# Patient Record
Sex: Male | Born: 1980 | Race: Black or African American | Hispanic: No | Marital: Single | Smoking: Current every day smoker
Health system: Southern US, Community
[De-identification: ages and names within clinical notes are randomized; demographics above are authoritative.]

---
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
## 2009-03-10 HISTORY — PX: CYST EXCISION: SHX5701

## 2012-01-15 ENCOUNTER — Emergency Department (HOSPITAL_COMMUNITY)
Admission: EM | Admit: 2012-01-15 | Discharge: 2012-01-15 | Disposition: A | Discharge status: Home / Self Care | Payer: Self-pay | Attending: Emergency Medicine | Admitting: Emergency Medicine

## 2012-01-15 ENCOUNTER — Encounter (HOSPITAL_COMMUNITY): Payer: Self-pay | Admitting: *Deleted

## 2012-01-15 DIAGNOSIS — J029 Acute pharyngitis, unspecified: Secondary | ICD-10-CM

## 2012-01-15 DIAGNOSIS — F17200 Nicotine dependence, unspecified, uncomplicated: Secondary | ICD-10-CM | POA: Insufficient documentation

## 2012-01-15 LAB — RAPID STREP SCREEN (MED CTR MEBANE ONLY): Streptococcus, Group A Screen (Direct): NEGATIVE

## 2012-01-15 MED ORDER — IBUPROFEN 800 MG PO TABS: 800.0000 mg | ORAL_TABLET | Freq: Three times a day (TID) | ORAL | Status: DC

## 2012-01-15 NOTE — ED Notes (Signed)
Pt c/o sore throat, painful to swallow. Tonsils mildly enlarged, red, no white patches noted.

## 2012-01-15 NOTE — ED Provider Notes (Signed)
History     CSN: [id number]  Arrival date & time 01/15/12  2028   First MD Initiated Contact with Patient 01/15/12 2213      Chief Complaint  Patient presents with  . Sore Throat    (Consider location/radiation/quality/duration/timing/severity/associated sxs/prior treatment) HPI History provided by pt.   Pt presents w/ c/o sore throat.  Scratchy yesterday and mildy painful today.  Aggravated by swallowing.  Associated w/ nasal congestion only.  Denies fever.  No known sick contacts.  No PMH.  History reviewed. No pertinent past medical history.  Past Surgical History  Procedure Date  . Cyst excision 2011    throat    History reviewed. No pertinent family history.  History  Substance Use Topics  . Smoking status: Current Every Day Smoker -- 1.0 packs/day  . Smokeless tobacco: Not on file  . Alcohol Use: Yes      Review of Systems  All other systems reviewed and are negative.    Allergies  Review of patient's allergies indicates no known allergies.  Home Medications   Current Outpatient Rx  Name  Route  Sig  Dispense  Refill  . IBUPROFEN 200 MG PO TABS   Oral   Take 400 mg by mouth every 6 (six) hours as needed. For pain         . IBUPROFEN 800 MG PO TABS   Oral   Take 1 tablet (800 mg total) by mouth 3 (three) times daily.   12 tablet   0     BP 132/78  Pulse 80  Temp 98 F (36.7 C) (Oral)  Resp 16  SpO2 96%  Physical Exam  Nursing note and vitals reviewed. Constitutional: He is oriented to person, place, and time. He appears well-developed and well-nourished. No distress.       Pt does not appear uncomfortable.   HENT:  Head: Normocephalic and atraumatic.  Mouth/Throat: Oropharynx is clear and moist. No oropharyngeal exudate.       No erythema of soft palate, tonsils or posterior pharynx.  No trismus and uvula mid-line.  Eyes:       Normal appearance  Neck: Normal range of motion.  Cardiovascular: Normal rate and regular rhythm.     Pulmonary/Chest: Effort normal and breath sounds normal. No respiratory distress.  Musculoskeletal: Normal range of motion.  Lymphadenopathy:    He has no cervical adenopathy.  Neurological: He is alert and oriented to person, place, and time.  Skin: Skin is warm and dry. No rash noted.  Psychiatric: He has a normal mood and affect. His behavior is normal.    ED Course  Procedures (including critical care time)   Labs Reviewed  RAPID STREP SCREEN   No results found.   1. Pharyngitis       MDM  31yo healthy M presents w/ sore throat and nasal congestion.  Exam not concerning for strep pharyngitis and rapid strep screen neg.  Recommended ibuprofen and sudafed.  Return precautions discussed.         Otilio Miu, [place] 01/16/12 [phone]

## 2012-01-15 NOTE — ED Notes (Signed)
Pt c/o sore throat since yesterday, denies n/v, cough, fever/chills

## 2012-01-17 NOTE — ED Provider Notes (Signed)
Medical screening examination/treatment/procedure(s) were performed by non-physician practitioner and as supervising physician I was immediately available for consultation/collaboration.   Lemmie Steinhaus R Oreste Majeed, MD 01/17/12 1556 

## 2012-11-12 ENCOUNTER — Emergency Department (HOSPITAL_COMMUNITY)
Admission: EM | Admit: 2012-11-12 | Discharge: 2012-11-12 | Disposition: A | Discharge status: Home / Self Care | Payer: Self-pay | Attending: Emergency Medicine | Admitting: Emergency Medicine

## 2012-11-12 ENCOUNTER — Emergency Department (HOSPITAL_COMMUNITY): Payer: Self-pay

## 2012-11-12 ENCOUNTER — Encounter (HOSPITAL_COMMUNITY): Payer: Self-pay | Admitting: Emergency Medicine

## 2012-11-12 DIAGNOSIS — J069 Acute upper respiratory infection, unspecified: Secondary | ICD-10-CM | POA: Insufficient documentation

## 2012-11-12 DIAGNOSIS — F17200 Nicotine dependence, unspecified, uncomplicated: Secondary | ICD-10-CM | POA: Insufficient documentation

## 2012-11-12 DIAGNOSIS — J9801 Acute bronchospasm: Secondary | ICD-10-CM | POA: Insufficient documentation

## 2012-11-12 DIAGNOSIS — M549 Dorsalgia, unspecified: Secondary | ICD-10-CM | POA: Insufficient documentation

## 2012-11-12 MED ORDER — ALBUTEROL SULFATE HFA 108 (90 BASE) MCG/ACT IN AERS
2.0000 | INHALATION_SPRAY | RESPIRATORY_TRACT | Status: DC | PRN
  Administered 2012-11-12: 2 via RESPIRATORY_TRACT
  Filled 2012-11-12: qty 6.7

## 2012-11-12 NOTE — ED Notes (Signed)
Has had cough x 1 -2 weeks. Went to [facility] and saw a nurse yesterday. Later started having low back pain with cough. States it is better today.

## 2012-11-12 NOTE — ED Provider Notes (Signed)
CSN: [id number]     Arrival date & time 11/12/12  1153 History   First MD Initiated Contact with Patient 11/12/12 1341     Chief Complaint  Patient presents with  . Cough  . Back Pain   (Consider location/radiation/quality/duration/timing/severity/associated sxs/prior Treatment) Patient is a 32 y.o. male presenting with cough and back pain. The history is provided by the patient. No language interpreter was used.  Cough Cough characteristics:  Productive and hoarse Severity:  Moderate Progression:  Worsening Chronicity:  New Smoker: yes   Associated symptoms: chest pain and rhinorrhea   Associated symptoms: no chills, no fever and no shortness of breath   Associated symptoms comment:  Dry cough for the past week, worse when he lies down. No fever. He is complaining of chest pain and back pain associated with cough. No SOB.  Back Pain Associated symptoms: chest pain   Associated symptoms: no abdominal pain and no fever     History reviewed. No pertinent past medical history. Past Surgical History  Procedure Laterality Date  . Cyst excision  2011    throat   History reviewed. No pertinent family history. History  Substance Use Topics  . Smoking status: Current Every Day Smoker -- 1.00 packs/day  . Smokeless tobacco: Not on file  . Alcohol Use: Yes    Review of Systems  Constitutional: Negative for fever and chills.  HENT: Positive for rhinorrhea.   Respiratory: Positive for cough. Negative for shortness of breath.   Cardiovascular: Positive for chest pain.       Chest pain with cough.  Gastrointestinal: Negative.  Negative for nausea, vomiting and abdominal pain.  Musculoskeletal: Positive for back pain.  Skin: Negative.   Neurological: Negative.     Allergies  Review of patient's allergies indicates no known allergies.  Home Medications  No current outpatient prescriptions on file. BP 125/70  Pulse 100  Temp(Src) 98.2 F (36.8 C) (Oral)  Resp 16  Wt 162 lb  (73.483 kg)  SpO2 96% Physical Exam  Constitutional: He is oriented to person, place, and time. He appears well-developed and well-nourished.  HENT:  Head: Normocephalic.  Neck: Normal range of motion. Neck supple.  Cardiovascular: Normal rate and regular rhythm.   Pulmonary/Chest: Effort normal and breath sounds normal. He has no wheezes. He has no rales. He exhibits no tenderness.  Abdominal: Soft. Bowel sounds are normal. There is no tenderness. There is no rebound and no guarding.  Musculoskeletal: Normal range of motion.  Neurological: He is alert and oriented to person, place, and time.  Skin: Skin is warm and dry. No rash noted.  Psychiatric: He has a normal mood and affect.    ED Course  Procedures (including critical care time) Labs Review Labs Reviewed - No data to display Imaging Review Dg Chest 2 View  11/12/2012   *RADIOLOGY REPORT*  Clinical Data: Cough and back pain  CHEST - 2 VIEW  Comparison: None.  Findings: Two-view chest shows no focal consolidation or pulmonary edema.  There is blunting of the left costophrenic angle which may be related to pleural scarring or a tiny effusion although no pleural effusion can be identified on the lateral image. The cardiopericardial silhouette is within normal limits for size. Convex leftward lower thoracic scoliosis noted.  IMPRESSION: Question tiny left pleural effusion versus pleural scarring.   Original Report Authenticated By: Kennith Center, M.D.    MDM  No diagnosis found. 1. URI 2. Bronchospasm  Patient stable in appearance, NAD, normal vital  signs. He is a smoker. Discussed supportive measures - use of Albuterol inhaler.     Arnoldo Hooker, PA-C 11/12/12 1417

## 2012-11-12 NOTE — ED Provider Notes (Signed)
Medical screening examination/treatment/procedure(s) were performed by non-physician practitioner and as supervising physician I was immediately available for consultation/collaboration.   William Davarion Cuffee, MD 11/12/12 1543 

## 2012-11-12 NOTE — ED Notes (Signed)
Instructed pt. On proper use of inhaler.  Pt. Demonstrated proper technique back.

## 2012-11-12 NOTE — ED Notes (Signed)
Pt c/o cough x 2 days and pain with cough; pt sts lower back pain

## 2013-10-30 ENCOUNTER — Emergency Department (HOSPITAL_COMMUNITY): Payer: Self-pay

## 2013-10-30 ENCOUNTER — Emergency Department (HOSPITAL_COMMUNITY)
Admission: EM | Admit: 2013-10-30 | Discharge: 2013-10-30 | Disposition: A | Discharge status: Home / Self Care | Payer: Self-pay | Attending: Emergency Medicine | Admitting: Emergency Medicine

## 2013-10-30 ENCOUNTER — Encounter (HOSPITAL_COMMUNITY): Payer: Self-pay | Admitting: Emergency Medicine

## 2013-10-30 DIAGNOSIS — S0510XA Contusion of eyeball and orbital tissues, unspecified eye, initial encounter: Secondary | ICD-10-CM | POA: Insufficient documentation

## 2013-10-30 DIAGNOSIS — S0083XA Contusion of other part of head, initial encounter: Secondary | ICD-10-CM | POA: Insufficient documentation

## 2013-10-30 DIAGNOSIS — S1093XA Contusion of unspecified part of neck, initial encounter: Secondary | ICD-10-CM

## 2013-10-30 DIAGNOSIS — F17200 Nicotine dependence, unspecified, uncomplicated: Secondary | ICD-10-CM | POA: Insufficient documentation

## 2013-10-30 DIAGNOSIS — T148XXA Other injury of unspecified body region, initial encounter: Secondary | ICD-10-CM

## 2013-10-30 DIAGNOSIS — S0003XA Contusion of scalp, initial encounter: Secondary | ICD-10-CM | POA: Insufficient documentation

## 2013-10-30 DIAGNOSIS — Z79899 Other long term (current) drug therapy: Secondary | ICD-10-CM | POA: Insufficient documentation

## 2013-10-30 MED ORDER — IBUPROFEN 800 MG PO TABS
800.0000 mg | ORAL_TABLET | Freq: Once | ORAL | Status: AC
  Administered 2013-10-30: 800 mg via ORAL
  Filled 2013-10-30: qty 1

## 2013-10-30 MED ORDER — HYDROCODONE-ACETAMINOPHEN 5-325 MG PO TABS: 2.0000 | ORAL_TABLET | ORAL | Status: DC | PRN

## 2013-10-30 MED ORDER — HYDROCODONE-ACETAMINOPHEN 5-325 MG PO TABS
2.0000 | ORAL_TABLET | Freq: Once | ORAL | Status: AC
  Administered 2013-10-30: 2 via ORAL
  Filled 2013-10-30: qty 2

## 2013-10-30 MED ORDER — NAPROXEN 500 MG PO TABS: 500.0000 mg | ORAL_TABLET | Freq: Two times a day (BID) | ORAL | Status: DC
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
## 2013-10-30 NOTE — Discharge Instructions (Signed)
Contusion A contusion is a deep bruise. Contusions happen when an injury causes bleeding under the skin. Signs of bruising include pain, puffiness (swelling), and discolored skin. The contusion may turn blue, purple, or yellow. HOME CARE   Put ice on the injured area.  Put ice in a plastic bag.  Place a towel between your skin and the bag.  Leave the ice on for 15-20 minutes, 03-04 times a day.  Only take medicine as told by your doctor.  Rest the injured area.  If possible, raise (elevate) the injured area to lessen puffiness. GET HELP RIGHT AWAY IF:   You have more bruising or puffiness.  You have pain that is getting worse.  Your puffiness or pain is not helped by medicine. MAKE SURE YOU:   Understand these instructions.  Will watch your condition.  Will get help right away if you are not doing well or get worse. Document Released: 08/13/2007 Document Revised: 05/19/2011 Document Reviewed: 12/30/2010 [facility] Patient Information 2015 [place], [place]. This information is not intended to replace advice given to you by your health care provider. Make sure you discuss any questions you have with your health care provider.    Emergency Department Resource Guide 1) Find a Doctor and Pay Out of Pocket Although you won't have to find out who is covered by your insurance plan, it is a good idea to ask around and get recommendations. You will then need to call the office and see if the doctor you have chosen will accept you as a new patient and what types of options they offer for patients who are self-pay. Some doctors offer discounts or will set up payment plans for their patients who do not have insurance, but you will need to ask so you aren't surprised when you get to your appointment.  2) Contact Your Local Health Department Not all health departments have doctors that can see patients for sick visits, but many do, so it is worth a call to see if yours does. If you don't know  where your local health department is, you can check in your phone book. The CDC also has a tool to help you locate your state's health department, and many state websites also have listings of all of their local health departments.  3) Find a Walk-in Clinic If your illness is not likely to be very severe or complicated, you may want to try a walk in clinic. These are popping up all over the country in pharmacies, drugstores, and shopping centers. They're usually staffed by nurse practitioners or physician assistants that have been trained to treat common illnesses and complaints. They're usually fairly quick and inexpensive. However, if you have serious medical issues or chronic medical problems, these are probably not your best option.  No Primary Care Doctor: - Call Health Connect at  [phone] - they can help you locate a primary care doctor that  accepts your insurance, provides certain services, etc. - Physician Referral Service- [phone]  Chronic Pain Problems: Organization         Address  Phone   Notes  [facility]  [phone] Patients need to be referred by their primary care doctor.   Medication Assistance: Organization         Address  Phone   Notes  [facility] Medication [facility] [address] [place] [place], [place] [address] [phone] --Must be a resident of [facility] -- Must have NO insurance coverage whatsoever (  no Medicaid/ Medicare, etc.) -- The pt. MUST have a primary care doctor that directs their care regularly and follows them in the community   [facility]  [phone]   [employer]  [phone]    Agencies that provide inexpensive medical care: Organization         Address  Phone   Notes  [facility]  [phone]   [facility]    [phone]   [facility] [address] [place], [place] [address] [phone]   [facility]  [address] [place] [phone]   Planned Parenthood    [phone]   [facility]    [phone]   Community Health and [facility]  [address], [place] Phone:  [phone], Fax:  [phone] Hours of Operation:  9 am - 6 pm, M-F.  Also accepts Medicaid/Medicare and self-pay.  [facility]  [address], [place] Phone: [phone], Fax: [phone]. Hours of Operation:  8:30 am - 5:30 pm, M-F.  Also accepts Medicaid and self-pay.  [facility] [place] [address], [place] Phone: [phone]   [facility] [address] Natasha Bence [place], [place] [phone], Ext. [phone] Mondays & Thursdays: 7-9 AM.  First 15 patients are seen on a first come, first serve basis.    Medicaid-accepting [facility] Providers:  Organization         Address  Phone   Notes  [facility] [address], [place] [phone] Also accepts self-pay patients.  [employer] [address] Laurell Josephs [place], [place]  [phone]   [facility] [address], [place] [phone]   [facility] [address], [place] [phone]   Renaye Rakers [address], [place]   [phone] Only accepts [place] Access [place] patients after they have their name applied to their card.   Self-Pay (no insurance) in [facility] [place]:  Organization         Address  Phone   Notes  Sickle Cell Patients, [facility] [address] [place], [place] [phone]   [facility] [address] [place], [place] [phone]   [facility] [place]  [address] [place] [address], [place] [phone]   [facility]/Dr. Osei-Bonsu  [address] [place] or [address], [place] [phone] Phone number for both  [place] and [place] locations is the same.  [facility] and [facility] [address], [place] [phone]   [facility] [address], [place] or [address] [phone]   [facility] [address] [place] [phone], phone; [phone], fax Sees patients 1st and 3rd Saturday of every month.  Must not qualify for public or private insurance (i.e. Medicaid, Medicare, [place] Health Choice, Veterans' Benefits)  Household income should be no more than 200% of the poverty level The clinic cannot treat you if you are pregnant or think you are pregnant  Sexually transmitted diseases are not treated at the clinic.    Dental Care: Organization         Address  Phone  Notes  [facility] Department of Public  [facility] [address] [place], [place] [phone] Accepts children up to age 83 who are enrolled in [place] or [place] Health Choice; pregnant women with a Medicaid card; and children who have applied for Medicaid or [place] Health Choice, but were declined, whose parents can pay a reduced fee at time of service.  [facility] Department of [place]  [address], [place] [phone] Accepts children up to age 52 who are enrolled in [place] or [place] Health Choice; pregnant women with a Medicaid card; and children who have applied for Medicaid or [place] Health Choice, but were declined, whose parents can pay a reduced fee at time of service.  [facility]  [address] [place], [place] [phone] Patients are seen by appointment only. Walk-ins are not accepted. [facility] will see patients 60 years of age and older. Monday - Tuesday (8am-5pm) Most Wednesdays (8:30-5pm) $30 per visit, cash only  [facility] Access PRO[facility]  [address], [facility] [phone] Patients are seen by appointment only. Walk-ins are not  accepted. [facility] will see patients 38 years of age and older. One Wednesday Evening (Monthly: Volunteer Based).  $30 per visit, cash only  [employer]  [phone] for adults; Children under age 38, call Graduate Pediatric Dentistry at [phone]. Children aged 72-14, please call [phone] to request a pediatric application.  Dental services are provided in all areas of dental care including fillings, crowns and bridges, complete and partial dentures, implants, gum treatment, root canals, and extractions. Preventive care is also provided. Treatment is provided to both adults and children. Patients are selected via a lottery and there is often a waiting list.   [facility] [address], [place]  [phone] [email or web address]   [facility] [address] [place], [place] [phone], Ext. [phone] Second and Fourth Thursday of each month, opens at 6:30 AM; Clinic ends at 9 AM.  Patients are seen on a first-come first-served basis, and a limited number are seen during each clinic.   [facility]  [address] Ether Griffins [place], [place] [phone]   Eligibility Requirements You must have lived in [place], [place], or [place] counties for at least the last three months.   You cannot be eligible for state or federal sponsored [place], including [employer], [place], or [employer].   You generally cannot be eligible for healthcare insurance through your employer.    How to apply: Eligibility screenings are held every Tuesday and Wednesday afternoon from 1:00 pm until 4:00 pm. You do not need an appointment for the interview!  [facility] [address], [place], [place] [phone]   [facility] Health Department  [phone]   [facility] Health Department  [phone]   [facility] Health Department  [phone]    Behavioral Health Resources in the  Community: Intensive Outpatient Programs Organization         Address  Phone  Notes  [facility] [address] [place], [place] [phone]   [facility] Outpatient [address] [place], [place] [phone]   [facility] [address] [place], [place]  [phone]   [facility] Mental Health [address],  [place], [place] [phone] or [phone]   Substance Abuse Resources Organization         Address  Phone  Notes  [facility]  [phone]   [facility]  [phone]   [facility]  [phone]   Floydene Flock  [phone]   Residential & Outpatient Substance Abuse Program  [phone]   Psychological Services Organization         Address  Phone  Notes  [facility]al Health  [phone]   [facility] Services  [phone]   [facility] Mental Health [address] [place] [phone] or [phone]    Mobile Crisis Teams Organization         Address  Phone  Notes  [facility], Mobile Crisis Care Unit  [phone]   [facility]  [address] [place], [place] [phone]   Doristine Locks [address] [place] [phone]    Self-Help/Support Groups Organization         Address  Phone             Notes  [facility] [place] - variety of support groups  [phone]- [phone] Call for more information  Narcotics Anonymous (NA), [facility] [address], [employer] [place]  2 meetings at this location   Statistician         Address  Phone  Notes  [facility] [address],    [place]  [phone]   [facility]  [address], [place] [phone], [place], [place] [phone]   [facility] [address] [place], [place] [phone] Admissions: 8am-3pm M-F  [facility] [address]    [place], [place] [phone]   [facility] [address] [place], [place], [place] [phone]   [facility] [address],  [place], [place] [phone]   [facility] - Intensive Outpatient [address] Laurell Josephs [address], [place], [place] [phone]   [facility] ([facility] [address] [place]  [place], [place] [phone] or [phone]   [facility] ([facility]) [address], [place], [place] [phone] Accepts Medicaid  [facility] [address]  [place] [phone] Substance Abuse/Addiction Treatment   [facility] Organization         Address  Phone  Notes  [facility]  [phone]   Angie Fava, PhD [address] Ervin Knack [place], [place]   [phone] or [phone]   [facility] Behavioral   [address] [place], [place] [phone]   [facility] [address] [place], [place] [phone] Insurance/Medicaid/sponsorship through [facility] [address]                                    [place], [place] [phone] Therapy/tele-psych/case  [facility] [address], [place] [phone]    Dr. Lolly Mustache  [phone]   [facility]  [employer] [facility] Dept. 1) [address], [place] 2) [address], [place] 3)  [address], [place] [phone]  [phone]   [facility] Child Abuse Hotline (878)615-9996 or (651)710-9751 (After Hours)        Take aleve as needed for pain and inflammation Take Norco as indicated every 4 hours for pain Refer to resource guide to follow up  with primary care as needed Return to emergency Department if you experience fevers nausea vomiting, changes in vision, worsening headache, numbness or weakness.

## 2013-10-30 NOTE — ED Notes (Signed)
MD at bedside. 

## 2015-03-04 ENCOUNTER — Emergency Department (HOSPITAL_COMMUNITY): Payer: Self-pay

## 2015-03-04 ENCOUNTER — Encounter (HOSPITAL_COMMUNITY): Payer: Self-pay | Admitting: Vascular Surgery

## 2015-03-04 ENCOUNTER — Emergency Department (HOSPITAL_COMMUNITY)
Admission: EM | Admit: 2015-03-04 | Discharge: 2015-03-04 | Disposition: A | Discharge status: Home / Self Care | Payer: Self-pay | Attending: Emergency Medicine | Admitting: Emergency Medicine

## 2015-03-04 DIAGNOSIS — Z79899 Other long term (current) drug therapy: Secondary | ICD-10-CM | POA: Insufficient documentation

## 2015-03-04 DIAGNOSIS — R0789 Other chest pain: Secondary | ICD-10-CM | POA: Insufficient documentation

## 2015-03-04 DIAGNOSIS — J069 Acute upper respiratory infection, unspecified: Secondary | ICD-10-CM | POA: Insufficient documentation

## 2015-03-04 DIAGNOSIS — F17210 Nicotine dependence, cigarettes, uncomplicated: Secondary | ICD-10-CM | POA: Insufficient documentation

## 2015-03-04 LAB — CBC
HCT: 44.6 % (ref 39.0–52.0)
HEMOGLOBIN: 14.9 g/dL (ref 13.0–17.0)
MCH: 31.2 pg (ref 26.0–34.0)
MCHC: 33.4 g/dL (ref 30.0–36.0)
MCV: 93.3 fL (ref 78.0–100.0)
PLATELETS: 260 10*3/uL (ref 150–400)
RBC: 4.78 MIL/uL (ref 4.22–5.81)
RDW: 13.9 % (ref 11.5–15.5)
WBC: 5.8 10*3/uL (ref 4.0–10.5)

## 2015-03-04 LAB — D-DIMER, QUANTITATIVE (NOT AT ARMC): D DIMER QUANT: 0.27 ug{FEU}/mL (ref 0.00–0.50)

## 2015-03-04 LAB — I-STAT TROPONIN, ED: TROPONIN I, POC: 0 ng/mL (ref 0.00–0.08)

## 2015-03-04 LAB — BASIC METABOLIC PANEL
Anion gap: 6 (ref 5–15)
BUN: 9 mg/dL (ref 6–20)
CHLORIDE: 108 mmol/L (ref 101–111)
CO2: 29 mmol/L (ref 22–32)
Calcium: 9.4 mg/dL (ref 8.9–10.3)
Creatinine, Ser: 0.92 mg/dL (ref 0.61–1.24)
Glucose, Bld: 82 mg/dL (ref 65–99)
Potassium: 4.3 mmol/L (ref 3.5–5.1)
SODIUM: 143 mmol/L (ref 135–145)

## 2015-03-04 MED ORDER — OMEPRAZOLE 20 MG PO CPDR: 20.0000 mg | DELAYED_RELEASE_CAPSULE | Freq: Every day | ORAL | Status: DC

## 2015-03-04 MED ORDER — NAPROXEN 250 MG PO TABS: 250.0000 mg | ORAL_TABLET | Freq: Two times a day (BID) | ORAL | Status: DC

## 2015-03-04 MED ORDER — BENZONATATE 100 MG PO CAPS
100.0000 mg | ORAL_CAPSULE | Freq: Three times a day (TID) | ORAL | Status: DC
Start: 2015-03-04 — End: 2015-09-09

## 2015-03-04 MED ORDER — CETIRIZINE HCL 10 MG PO TABS: 10.0000 mg | ORAL_TABLET | Freq: Every day | ORAL | Status: DC

## 2015-03-04 NOTE — ED Notes (Signed)
Pt reports to the ED for eval of CP and productive green cough x 2 weeks. Pt reports he had left upper back pain with coughing for several weeks and then last pm the pain moved to his chest. Denies any nausea or SOB. 12 lead en route unremarkable. Pt had 324 of ASA and 2 nitros en route and his pain went from a 4/10 to a 2/10. Pt reports the pain is worse with coughing and deep breathing. Pt A&Ox4, resp e/u, and skin warm and dry.

## 2015-03-04 NOTE — ED Notes (Signed)
Pt stable, ambulatory, states understanding of discharge instructions 

## 2015-03-04 NOTE — Discharge Instructions (Signed)
Upper Respiratory Infection, Adult °Most upper respiratory infections (URIs) are a viral infection of the air passages leading to the lungs. A URI affects the nose, throat, and upper air passages. The most common type of URI is nasopharyngitis and is typically referred to as "the common cold." °URIs run their course and usually go away on their own. Most of the time, a URI does not require medical attention, but sometimes a bacterial infection in the upper airways can follow a viral infection. This is called a secondary infection. Sinus and middle ear infections are common types of secondary upper respiratory infections. °Bacterial pneumonia can also complicate a URI. A URI can worsen asthma and chronic obstructive pulmonary disease (COPD). Sometimes, these complications can require emergency medical care and may be life threatening.  °CAUSES °Almost all URIs are caused by viruses. A virus is a type of germ and can spread from one person to another.  °RISKS FACTORS °You may be at risk for a URI if:  °· You smoke.   °· You have chronic heart or lung disease. °· You have a weakened defense (immune) system.   °· You are very young or very old.   °· You have nasal allergies or asthma. °· You work in crowded or poorly ventilated areas. °· You work in health care facilities or schools. °SIGNS AND SYMPTOMS  °Symptoms typically develop 2-3 days after you come in contact with a cold virus. Most viral URIs last 7-10 days. However, viral URIs from the influenza virus (flu virus) can last 14-18 days and are typically more severe. Symptoms may include:  °· Runny or stuffy (congested) nose.   °· Sneezing.   °· Cough.   °· Sore throat.   °· Headache.   °· Fatigue.   °· Fever.   °· Loss of appetite.   °· Pain in your forehead, behind your eyes, and over your cheekbones (sinus pain). °· Muscle aches.   °DIAGNOSIS  °Your health care provider may diagnose a URI by: °· Physical exam. °· Tests to check that your symptoms are not due to  another condition such as: °· Strep throat. °· Sinusitis. °· Pneumonia. °· Asthma. °TREATMENT  °A URI goes away on its own with time. It cannot be cured with medicines, but medicines may be prescribed or recommended to relieve symptoms. Medicines may help: °· Reduce your fever. °· Reduce your cough. °· Relieve nasal congestion. °HOME CARE INSTRUCTIONS  °· Take medicines only as directed by your health care provider.   °· Gargle warm saltwater or take cough drops to comfort your throat as directed by your health care provider. °· Use a warm mist humidifier or inhale steam from a shower to increase air moisture. This may make it easier to breathe. °· Drink enough fluid to keep your urine clear or pale yellow.   °· Eat soups and other clear broths and maintain good nutrition.   °· Rest as needed.   °· Return to work when your temperature has returned to normal or as your health care provider advises. You may need to stay home longer to avoid infecting others. You can also use a face mask and careful hand washing to prevent spread of the virus. °· Increase the usage of your inhaler if you have asthma.   °· Do not use any tobacco products, including cigarettes, chewing tobacco, or electronic cigarettes. If you need help quitting, ask your health care provider. °PREVENTION  °The best way to protect yourself from getting a cold is to practice good hygiene.  °· Avoid oral or hand contact with people with cold   symptoms.   °· Wash your hands often if contact occurs.   °There is no clear evidence that vitamin C, vitamin E, echinacea, or exercise reduces the chance of developing a cold. However, it is always recommended to get plenty of rest, exercise, and practice good nutrition.  °SEEK MEDICAL CARE IF:  °· You are getting worse rather than better.   °· Your symptoms are not controlled by medicine.   °· You have chills. °· You have worsening shortness of breath. °· You have brown or red mucus. °· You have yellow or brown nasal  discharge. °· You have pain in your face, especially when you bend forward. °· You have a fever. °· You have swollen neck glands. °· You have pain while swallowing. °· You have white areas in the back of your throat. °SEEK IMMEDIATE MEDICAL CARE IF:  °· You have severe or persistent: °¨ Headache. °¨ Ear pain. °¨ Sinus pain. °¨ Chest pain. °· You have chronic lung disease and any of the following: °¨ Wheezing. °¨ Prolonged cough. °¨ Coughing up blood. °¨ A change in your usual mucus. °· You have a stiff neck. °· You have changes in your: °¨ Vision. °¨ Hearing. °¨ Thinking. °¨ Mood. °MAKE SURE YOU:  °· Understand these instructions. °· Will watch your condition. °· Will get help right away if you are not doing well or get worse. °  °This information is not intended to replace advice given to you by your health care provider. Make sure you discuss any questions you have with your health care provider. °  °Document Released: 08/20/2000 Document Revised: 07/11/2014 Document Reviewed: 06/01/2013 °Elsevier Interactive Patient Education ©2016 [employer]vier Inc. ° °Chest Wall Pain °Chest wall pain is pain in or around the bones and muscles of your chest. Sometimes, an injury causes this pain. Sometimes, the cause may not be known. This pain may take several weeks or longer to get better. °HOME CARE INSTRUCTIONS  °Pay attention to any changes in your symptoms. Take these actions to help with your pain:  °· Rest as told by your health care provider.   °· Avoid activities that cause pain. These include any activities that use your chest muscles or your abdominal and side muscles to lift heavy items.    °· If directed, apply ice to the painful area: °¨ Put ice in a plastic bag. °¨ Place a towel between your skin and the bag. °¨ Leave the ice on for 20 minutes, 2-3 times per day. °· Take over-the-counter and prescription medicines only as told by your health care provider. °· Do not use tobacco products, including cigarettes, chewing  tobacco, and e-cigarettes. If you need help quitting, ask your health care provider. °· Keep all follow-up visits as told by your health care provider. This is important. °SEEK MEDICAL CARE IF: °· You have a fever. °· Your chest pain becomes worse. °· You have new symptoms. °SEEK IMMEDIATE MEDICAL CARE IF: °· You have nausea or vomiting. °· You feel sweaty or light-headed. °· You have a cough with phlegm (sputum) or you cough up blood. °· You develop shortness of breath. °  °This information is not intended to replace advice given to you by your health care provider. Make sure you discuss any questions you have with your health care provider. °  °Document Released: 02/24/2005 Document Revised: 11/15/2014 Document Reviewed: 05/22/2014 °Elsevier Interactive Patient Education ©2016 Elsevier Inc. ° °

## 2015-03-04 NOTE — ED Provider Notes (Signed)
CSN: [id number]     Arrival date & time 03/04/15  1210 History   First MD Initiated Contact with Patient 03/04/15 1458     Chief Complaint  Patient presents with  . Cough  . Chest Pain   John Kane is a 34 y.o. male who is otherwise healthy who presents to the emergency department complaining of a productive cough for the past 2 weeks with associated chest pain last night. Patient reports his attic opportunity weeks and started having left-sided nonradiating chest pain that is worse with coughing and deep inspiration since last night. He currently complains of 3 out of 10 pain in his left chest. He denies any shortness of breath or palpitations. The patient is a smoker. He denies personal or close family history of cardiovascular disease. Denies personal close family history of DVTs or PEs. Patient denies fevers, chills, ear pain, sore throat, trouble swallowing, shortness of breath, wheezing, chest tightness, abdominal pain, nausea, vomiting, diarrhea, leg pain, leg swelling, or rashes.  (Consider location/radiation/quality/duration/timing/severity/associated sxs/prior Treatment) HPI  History reviewed. No pertinent past medical history. Past Surgical History  Procedure Laterality Date  . Cyst excision  2011    throat   No family history on file. Social History  Substance Use Topics  . Smoking status: Current Every Day Smoker -- 1.00 packs/day    Types: Cigarettes  . Smokeless tobacco: Never Used  . Alcohol Use: Yes     Comment: q week    Review of Systems  Constitutional: Negative for fever and chills.  HENT: Negative for congestion and sore throat.   Eyes: Negative for visual disturbance.  Respiratory: Positive for cough. Negative for chest tightness, shortness of breath and wheezing.   Cardiovascular: Positive for chest pain. Negative for palpitations and leg swelling.  Gastrointestinal: Negative for nausea, vomiting, abdominal pain and diarrhea.  Genitourinary: Negative  for dysuria.  Musculoskeletal: Negative for back pain and neck pain.  Skin: Negative for rash.  Neurological: Negative for headaches.      Allergies  Review of patient's allergies indicates no known allergies.  Home Medications   Prior to Admission medications   Medication Sig Start Date End Date Taking? Authorizing Provider  benzonatate (TESSALON) 100 MG capsule Take 1 capsule (100 mg total) by mouth every 8 (eight) hours. 03/04/15   Everlene Farrier, PA-C  cetirizine (ZYRTEC ALLERGY) 10 MG tablet Take 1 tablet (10 mg total) by mouth daily. 03/04/15   Everlene Farrier, PA-C  HYDROcodone-acetaminophen (NORCO/VICODIN) 5-325 MG per tablet Take 2 tablets by mouth every 4 (four) hours as needed. 10/30/13   Joycie Peek, PA-C  naproxen (NAPROSYN) 250 MG tablet Take 1 tablet (250 mg total) by mouth 2 (two) times daily with a meal. 03/04/15   Everlene Farrier, PA-C  nicotine (NICODERM CQ - DOSED IN MG/24 HOURS) 21 mg/24hr patch Place 21 mg onto the skin daily.    Historical Provider, MD  omeprazole (PRILOSEC) 20 MG capsule Take 1 capsule (20 mg total) by mouth daily. 03/04/15   Everlene Farrier, PA-C   BP 111/90 mmHg  Pulse 79  Temp(Src) 98.2 F (36.8 C) (Oral)  Resp 14  SpO2 98% Physical Exam  Constitutional: He is oriented to person, place, and time. He appears well-developed and well-nourished. No distress.  Nontoxic appearing.  HENT:  Head: Normocephalic and atraumatic.  Right Ear: External ear normal.  Left Ear: External ear normal.  Mouth/Throat: Oropharynx is clear and moist. No oropharyngeal exudate.  Bilateral tympanic membranes are pearly-gray without erythema or loss  of landmarks.  Boggy nasal turbinates bilaterally.   Eyes: Conjunctivae are normal. Pupils are equal, round, and reactive to light. Right eye exhibits no discharge. Left eye exhibits no discharge.  Neck: Normal range of motion. Neck supple. No JVD present. No tracheal deviation present.  Cardiovascular: Normal  rate, regular rhythm, normal heart sounds and intact distal pulses.  Exam reveals no gallop and no friction rub.   No murmur heard. Pulmonary/Chest: Effort normal and breath sounds normal. No respiratory distress. He has no wheezes. He has no rales. He exhibits tenderness.  Lungs are clear to auscultation bilaterally. Substernal chest wall is tender to palpation and reproduces his chest pain.  Abdominal: Soft. Bowel sounds are normal. He exhibits no distension. There is no tenderness.  Musculoskeletal: He exhibits no edema or tenderness.  No lower extremity edema or tenderness.  Lymphadenopathy:    He has no cervical adenopathy.  Neurological: He is alert and oriented to person, place, and time. Coordination normal.  Skin: Skin is warm and dry. No rash noted. He is not diaphoretic. No erythema. No pallor.  Psychiatric: He has a normal mood and affect. His behavior is normal.  Nursing note and vitals reviewed.   ED Course  Procedures (including critical care time) Labs Review Labs Reviewed  BASIC METABOLIC PANEL  CBC  D-DIMER, QUANTITATIVE (NOT AT [facility])  Rosezena Sensor, ED    Imaging Review Dg Chest 2 View  03/04/2015  CLINICAL DATA:  Left chest pain for 2 days EXAM: CHEST  2 VIEW COMPARISON:  November 12, 2012 FINDINGS: The heart size and mediastinal contours are within normal limits. There is a 3 mm calcified granuloma in the right upper lobe. There is a 3 mm calcified granuloma in the left upper lobe. There is chronic minimal blunting of left costophrenic angle. There is no focal infiltrate or pulmonary edema. The visualized skeletal structures are stable. IMPRESSION: No active cardiopulmonary disease. Electronically Signed   By: Sherian Rein M.D.   On: 03/04/2015 13:01   I have personally reviewed and evaluated these images and lab results as part of my medical decision-making.   EKG Interpretation   Date/Time:  Sunday March 04 2015 12:14:07 EST Ventricular Rate:  102 PR  Interval:  182 QRS Duration: 68 QT Interval:  304 QTC Calculation: 396 R Axis:   -59 Text Interpretation:  Sinus tachycardia Left anterior fascicular block  Possible Anterior infarct , age undetermined Abnormal ECG No previous ECGs  available Confirmed by YAO  MD, DAVID ([id number]) on 03/04/2015 4:01:07 PM      Filed Vitals:   03/04/15 1218 03/04/15 1500  BP: 110/67 111/90  Pulse: 101 79  Temp: 98.2 F (36.8 C)   TempSrc: Oral   Resp: 16 14  SpO2: 98% 98%     MDM   Meds given in ED:  Medications - No data to display  New Prescriptions   BENZONATATE (TESSALON) 100 MG CAPSULE    Take 1 capsule (100 mg total) by mouth every 8 (eight) hours.   CETIRIZINE (ZYRTEC ALLERGY) 10 MG TABLET    Take 1 tablet (10 mg total) by mouth daily.   NAPROXEN (NAPROSYN) 250 MG TABLET    Take 1 tablet (250 mg total) by mouth 2 (two) times daily with a meal.   OMEPRAZOLE (PRILOSEC) 20 MG CAPSULE    Take 1 capsule (20 mg total) by mouth daily.    Final diagnoses:  URI (upper respiratory infection)  Chest wall pain   This is a  34 y.o. male who is otherwise healthy who presents to the emergency department complaining of a productive cough for the past 2 weeks with associated chest pain last night. Patient reports his attic opportunity weeks and started having left-sided nonradiating chest pain that is worse with coughing and deep inspiration since last night. He currently complains of 3 out of 10 pain in his left chest. He denies any shortness of breath or palpitations. The patient is a smoker. He denies personal or close family history of cardiovascular disease.   On examination his afebrile nontoxic appearing. His lungs are clear to auscultation bilaterally. He has substernal chest wall tenderness to palpation which reproduces his chest pain. The patient has not tachypneic, tachycardic or hypoxic. No STEMI on EKG. Troponin is negative. D-dimer is negative. Work is unremarkable. Chest x-ray shows no acute  cardiopulmonary disease. Patient with upper respiratory infection and cough. Patient also complaining of lots of acid reflux symptoms. We'll discharge the patient prescribed Tessalon Perles, Zyrtec, naproxen and omeprazole. I advised the patient to follow-up with their primary care provider this week. I advised the patient to return to the emergency department with new or worsening symptoms or new concerns. The patient verbalized understanding and agreement with plan.    Everlene FarrierWilliam Daimion Adamcik, PA-C 03/04/15 1620  Richardean Canalavid H Yao, MD 03/04/15 [phone]

## 2015-03-14 ENCOUNTER — Telehealth: Payer: Self-pay | Admitting: *Deleted

## 2015-03-14 NOTE — Telephone Encounter (Signed)
Pt states he lost his Rx received 12/25; asked if NCM would call in to pharmacy.  NCM called [facility] on [address] to give Rx information for pt pickup today.

## 2015-03-21 ENCOUNTER — Telehealth: Payer: Self-pay | Admitting: *Deleted

## 2015-09-09 ENCOUNTER — Encounter (HOSPITAL_COMMUNITY): Payer: Self-pay | Admitting: Emergency Medicine

## 2015-09-09 ENCOUNTER — Emergency Department (HOSPITAL_COMMUNITY)
Admission: EM | Admit: 2015-09-09 | Discharge: 2015-09-09 | Disposition: A | Discharge status: Home / Self Care | Payer: Self-pay | Attending: Emergency Medicine | Admitting: Emergency Medicine

## 2015-09-09 DIAGNOSIS — F17210 Nicotine dependence, cigarettes, uncomplicated: Secondary | ICD-10-CM | POA: Insufficient documentation

## 2015-09-09 DIAGNOSIS — R634 Abnormal weight loss: Secondary | ICD-10-CM | POA: Insufficient documentation

## 2015-09-09 DIAGNOSIS — Z6823 Body mass index (BMI) 23.0-23.9, adult: Secondary | ICD-10-CM | POA: Insufficient documentation

## 2015-09-09 LAB — RAPID URINE DRUG SCREEN, HOSP PERFORMED
Amphetamines: NOT DETECTED
Barbiturates: NOT DETECTED
Benzodiazepines: NOT DETECTED
COCAINE: NOT DETECTED
OPIATES: NOT DETECTED
TETRAHYDROCANNABINOL: POSITIVE — AB

## 2015-09-09 LAB — CBC WITH DIFFERENTIAL/PLATELET
BASOS PCT: 0 %
Basophils Absolute: 0 10*3/uL (ref 0.0–0.1)
EOS ABS: 0.1 10*3/uL (ref 0.0–0.7)
Eosinophils Relative: 2 %
HCT: 40.1 % (ref 39.0–52.0)
Hemoglobin: 13.1 g/dL (ref 13.0–17.0)
Lymphocytes Relative: 19 %
Lymphs Abs: 1.3 10*3/uL (ref 0.7–4.0)
MCH: 30.7 pg (ref 26.0–34.0)
MCHC: 32.7 g/dL (ref 30.0–36.0)
MCV: 93.9 fL (ref 78.0–100.0)
MONOS PCT: 9 %
Monocytes Absolute: 0.6 10*3/uL (ref 0.1–1.0)
NEUTROS PCT: 70 %
Neutro Abs: 4.9 10*3/uL (ref 1.7–7.7)
Platelets: 242 10*3/uL (ref 150–400)
RBC: 4.27 MIL/uL (ref 4.22–5.81)
RDW: 13.2 % (ref 11.5–15.5)
WBC: 7 10*3/uL (ref 4.0–10.5)

## 2015-09-09 LAB — COMPREHENSIVE METABOLIC PANEL
ALK PHOS: 48 U/L (ref 38–126)
ALT: 17 U/L (ref 17–63)
ANION GAP: 4 — AB (ref 5–15)
AST: 16 U/L (ref 15–41)
Albumin: 3.3 g/dL — ABNORMAL LOW (ref 3.5–5.0)
BUN: 8 mg/dL (ref 6–20)
CALCIUM: 8.8 mg/dL — AB (ref 8.9–10.3)
CHLORIDE: 110 mmol/L (ref 101–111)
CO2: 25 mmol/L (ref 22–32)
Creatinine, Ser: 0.84 mg/dL (ref 0.61–1.24)
GFR calc non Af Amer: >60 mL/min (ref >60)
GLUCOSE: 110 mg/dL — AB (ref 65–99)
Potassium: 3.7 mmol/L (ref 3.5–5.1)
SODIUM: 139 mmol/L (ref 135–145)
Total Bilirubin: 0.5 mg/dL (ref 0.3–1.2)
Total Protein: 6 g/dL — ABNORMAL LOW (ref 6.5–8.1)

## 2015-09-09 LAB — URINALYSIS, ROUTINE W REFLEX MICROSCOPIC
BILIRUBIN URINE: NEGATIVE
Glucose, UA: NEGATIVE mg/dL
KETONES UR: NEGATIVE mg/dL
NITRITE: NEGATIVE
Protein, ur: NEGATIVE mg/dL
SPECIFIC GRAVITY, URINE: 1.024 (ref 1.005–1.030)
pH: 6 (ref 5.0–8.0)

## 2015-09-09 LAB — URINE MICROSCOPIC-ADD ON
BACTERIA UA: NONE SEEN
Squamous Epithelial / LPF: NONE SEEN
WBC UA: NONE SEEN WBC/hpf (ref 0–5)

## 2015-09-09 NOTE — ED Notes (Signed)
C/o approx. 30 lb weight loss since November.  States he isn't sleeping well and reports feeling tired for the past few weeks.

## 2015-09-09 NOTE — ED Notes (Signed)
Pt called out and asked EMT-P when he would be receiving his discharge paperwork.  EMT-P told pt he was not yet up for discharge but he would try to find out for him.  This RN walked by the room, and noted gown was on floor and pt was no longer in room.  Looked for pt in RR, hallways, and lobby.  Did not note pt's presence.  MD went to room to discuss discharge instructions with patient, patient still not in room.  Was not able to provide discharge instructions for patient.  MD aware.

## 2015-09-09 NOTE — ED Notes (Signed)
Pt sts he has been unable to provide urine.  This RN provided with water and encouraged pt to stand at bedside to attempt specimen.  Will call RN when done drinking H20

## 2015-09-09 NOTE — ED Notes (Signed)
Pt made aware of need for urine.  Provided with urinal.

## 2015-09-09 NOTE — ED Notes (Signed)
Pt c/o recent weight loss (30lbs since Nov 2016).  Denies any attempts to lose.  Denies any pain at this time.

## 2015-09-09 NOTE — ED Notes (Signed)
Unable to obtain signature.  Pt left before d/c paperwork ready.

## 2015-09-09 NOTE — ED Provider Notes (Signed)
CSN: [id number]     Arrival date & time 09/09/15  1251 History   First MD Initiated Contact with Patient 09/09/15 1305     Chief Complaint  Patient presents with  . Weight Loss     (Consider location/radiation/quality/duration/timing/severity/associated sxs/prior Treatment) HPI Patient reports about 30 pounds of weight loss since November, approximately 8 months. Patient reports that November is the time when [place]. He states that he has been working a job at [employer]. He reports he doesn't eat much in the morning and may skip breakfast. He reports usually at lunchtime he only has time to eat snacks like chips or other small snack. He [place]. He states that if the food is good he will usually eat his entire meal however if it something he doesn't like he'll only eat part of it. Patient also reports that he walks several miles a day. He smokes a half a pack to 1 pack of cigarettes per day. He drinks several beers a few times a week but not daily alcohol. Occasional marijuana use. He denies other drugs of abuse. Patient denies fevers, chills or night sweats. He does not have abdominal pain while eating or after eating. He does not have vomiting. He states that his girlfriend says he looks frail. History reviewed. No pertinent past medical history. Past Surgical History  Procedure Laterality Date  . Cyst excision  2011    throat   No family history on file. Social History  Substance Use Topics  . Smoking status: Current Every Day Smoker -- 1.00 packs/day    Types: Cigarettes  . Smokeless tobacco: Never Used  . Alcohol Use: Yes     Comment: q week    Review of Systems 10 Systems reviewed and are negative for acute change except as noted in the HPI.    Allergies  Review of patient's allergies indicates no known allergies.  Home Medications   Prior to Admission medications   Medication Sig Start Date End Date Taking?  Authorizing Provider  sertraline (ZOLOFT) 50 MG tablet Take 50 mg by mouth daily.   Yes Historical Provider, MD   BP 123/81 mmHg  Pulse 94  Temp(Src) 98.4 F (36.9 C) (Oral)  Resp 16  Ht 5\' 5"  (1.651 m)  Wt 140 lb (63.504 kg)  BMI 23.30 kg/m2  SpO2 98% Physical Exam  Constitutional: He is oriented to person, place, and time. He appears well-developed and well-nourished.  HENT:  Head: Normocephalic and atraumatic.  Nose: Nose normal.  Mouth/Throat: Oropharynx is clear and moist.  Eyes: EOM are normal. Pupils are equal, round, and reactive to light.  Neck: Neck supple. No thyromegaly present.  Cardiovascular: Normal rate, regular rhythm, normal heart sounds and intact distal pulses.   Pulmonary/Chest: Effort normal and breath sounds normal.  No axillary lymphadenopathy.  Abdominal: Soft. Bowel sounds are normal. He exhibits no distension. There is no tenderness.  Musculoskeletal: Normal range of motion. He exhibits no edema or tenderness.  Normal muscle tone and development. No atrophy.  Lymphadenopathy:    He has no cervical adenopathy.  Neurological: He is alert and oriented to person, place, and time. He has normal strength. He exhibits normal muscle tone. Coordination normal. GCS eye subscore is 4. GCS verbal subscore is 5. GCS motor subscore is 6.  Skin: Skin is warm, dry and intact. No rash noted.  Psychiatric: He has a normal mood and affect.    ED  Course  Procedures (including critical care time) Labs Review Labs Reviewed  COMPREHENSIVE METABOLIC PANEL - Abnormal; Notable for the following:    Glucose, Bld 110 (*)    Calcium 8.8 (*)    Total Protein 6.0 (*)    Albumin 3.3 (*)    Anion gap 4 (*)    All other components within normal limits  URINALYSIS, ROUTINE W REFLEX MICROSCOPIC (NOT AT [facility]) - Abnormal; Notable for the following:    Hgb urine dipstick SMALL (*)    Leukocytes, UA TRACE (*)    All other components within normal limits  URINE RAPID DRUG SCREEN,  HOSP PERFORMED - Abnormal; Notable for the following:    Tetrahydrocannabinol POSITIVE (*)    All other components within normal limits  CBC WITH DIFFERENTIAL/PLATELET  URINE MICROSCOPIC-ADD ON  HIV ANTIBODY (ROUTINE TESTING)  HEPATITIS PANEL, ACUTE    Imaging Review No results found. I have personally reviewed and evaluated these images and lab results as part of my medical decision-making.   EKG Interpretation None      MDM   Final diagnoses:  Weight loss, unintentional   Patient has normal BMI. Physical condition is good. Weight loss appears most likely due to increased activity with less time to take larger meals. Also since moving to the [place], his lifestyle and food consumption at change. Patient counseled to follow-up with her family provider to continue monitoring for any ongoing weight loss or other concerning findings. At this time, he appears healthy and of normal physical condition.    Arby BarretteMarcy Melesa Lecy, MD 09/09/15 [phone]

## 2015-09-09 NOTE — Discharge Instructions (Signed)
Daily Weight Record At this time, your weight is normal for your body size. Your weight loss appears most likely due to increased activity and exercise and decreased eating due to lifestyle changes. Use this chart below to monitor your weight and determine if you are continuing to lose weight. He must follow-up with the family doctor. Use the resources to schedule recheck within the next 2-4 weeks. It is important to weigh yourself daily. Keep this daily weight chart near your scale. Weigh yourself each morning at the same time. Weigh yourself without shoes, and wear the same amount of clothing each day. Compare today's weight to yesterday's weight. Bring this form with you to your follow-up appointments. Call your health care provider if you have concerns about your weight, including rapid weight gain or rapid weight loss. Date: ________ Weight: ____________________ Date: ________ Weight: ____________________ Date: ________ Weight: ____________________ Date: ________ Weight: ____________________ Date: ________ Weight: ____________________ Date: ________ Weight: ____________________ Date: ________ Weight: ____________________ Date: ________ Weight: ____________________ Date: ________ Weight: ____________________ Date: ________ Weight: ____________________ Date: ________ Weight: ____________________ Date: ________ Weight: ____________________ Date: ________ Weight: ____________________ Date: ________ Weight: ____________________ Date: ________ Weight: ____________________ Date: ________ Weight: ____________________ Date: ________ Weight: ____________________ Date: ________ Weight: ____________________ Date: ________ Weight: ____________________ Date: ________ Weight: ____________________ Date: ________ Weight: ____________________ Date: ________ Weight: ____________________ Date: ________ Weight: ____________________ Date: ________ Weight: ____________________ Date: ________ Weight:  ____________________ Date: ________ Weight: ____________________ Date: ________ Weight: ____________________ Date: ________ Weight: ____________________ Date: ________ Weight: ____________________ Date: ________ Weight: ____________________ Date: ________ Weight: ____________________ Date: ________ Weight: ____________________ Date: ________ Weight: ____________________ Date: ________ Weight: ____________________ Date: ________ Weight: ____________________ Date: ________ Weight: ____________________ Date: ________ Weight: ____________________ Date: ________ Weight: ____________________ Date: ________ Weight: ____________________ Date: ________ Weight: ____________________ Date: ________ Weight: ____________________ Date: ________ Weight: ____________________ Date: ________ Weight: ____________________ Date: ________ Weight: ____________________ Date: ________ Weight: ____________________ Date: ________ Weight: ____________________ Date: ________ Weight: ____________________ Date: ________ Weight: ____________________ Date: ________ Weight: ____________________ Date: ________ Weight: ____________________   This information is not intended to replace advice given to you by your health care provider. Make sure you discuss any questions you have with your health care provider.   Document Released: 05/08/2006 Document Revised: 03/17/2014 Document Reviewed: 09/23/2013 Elsevier Interactive Patient Education [employer]2016 [employer]

## 2015-09-10 LAB — HEPATITIS PANEL, ACUTE
HCV Ab: <0.1 s/co ratio (ref 0.0–0.9)
HEP B C IGM: NEGATIVE
Hep A IgM: NEGATIVE
Hepatitis B Surface Ag: NEGATIVE

## 2015-09-10 LAB — HIV ANTIBODY (ROUTINE TESTING W REFLEX): HIV Screen 4th Generation wRfx: NONREACTIVE

## 2016-07-29 ENCOUNTER — Emergency Department (HOSPITAL_COMMUNITY): Payer: Self-pay

## 2016-07-29 ENCOUNTER — Emergency Department (HOSPITAL_COMMUNITY)
Admission: EM | Admit: 2016-07-29 | Discharge: 2016-07-29 | Disposition: A | Discharge status: Home / Self Care | Payer: Self-pay | Attending: Emergency Medicine | Admitting: Emergency Medicine

## 2016-07-29 ENCOUNTER — Encounter (HOSPITAL_COMMUNITY): Payer: Self-pay

## 2016-07-29 DIAGNOSIS — Y929 Unspecified place or not applicable: Secondary | ICD-10-CM | POA: Insufficient documentation

## 2016-07-29 DIAGNOSIS — Y999 Unspecified external cause status: Secondary | ICD-10-CM | POA: Insufficient documentation

## 2016-07-29 DIAGNOSIS — M25511 Pain in right shoulder: Secondary | ICD-10-CM | POA: Insufficient documentation

## 2016-07-29 DIAGNOSIS — Y939 Activity, unspecified: Secondary | ICD-10-CM | POA: Insufficient documentation

## 2016-07-29 DIAGNOSIS — G8929 Other chronic pain: Secondary | ICD-10-CM | POA: Insufficient documentation

## 2016-07-29 DIAGNOSIS — X501XXA Overexertion from prolonged static or awkward postures, initial encounter: Secondary | ICD-10-CM | POA: Insufficient documentation

## 2016-07-29 DIAGNOSIS — F17210 Nicotine dependence, cigarettes, uncomplicated: Secondary | ICD-10-CM | POA: Insufficient documentation

## 2016-07-29 MED ORDER — NAPROXEN 500 MG PO TABS: 500.0000 mg | ORAL_TABLET | Freq: Two times a day (BID) | ORAL | 0 refills | Status: DC

## 2016-07-29 NOTE — ED Provider Notes (Signed)
[facility]-EMERGENCY DEPT Provider Note   CSN: [id number] Arrival date & time: 07/29/16  1758  By signing my name below, I, John Kane, attest that this documentation has been prepared under the direction and in the presence of John Mornavid Dishon Kehoe, NP.  Electronically Signed: Phillips ClimesFabiola de Kane, Scribe. 07/29/2016. 8:27 PM.  History   Chief Complaint No chief complaint on file.   HPI Comments John Kane Hockett is a 36 y.o. male with a no significant PMHx, who presents to the Emergency Department with complaints of his acute on chronic daily right shoulder pain x1 year, which worsened x1 day ago. Pt denies hx of injury, but reports hearing a "popping" sound with movement. Pain is rated 5/10 in severity. Pt has has no symptomatic relief with ibuprofen. He denies experiencing any other acute sx, including nausea, vomiting, abdominal pain, headache, fever or chills.   The history is provided by the patient and medical records. No language interpreter was used.   History reviewed. No pertinent past medical history.  There are no active problems to display for this patient.  Past Surgical History:  Procedure Laterality Date  . CYST EXCISION  2011   throat    Home Medications    Prior to Admission medications   Medication Sig Start Date End Date Taking? Authorizing Provider  sertraline (ZOLOFT) 50 MG tablet Take 50 mg by mouth daily.    [provider]   Family History History reviewed. No pertinent family history.  Social History Social History  Substance Use Topics  . Smoking status: Current Every Day Smoker    Packs/day: 1.00    Types: Cigarettes  . Smokeless tobacco: Never Used  . Alcohol use Yes     Comment: q week   Allergies   Patient has no known allergies.  Review of Systems Review of Systems  Constitutional: Negative for chills and fever.  Gastrointestinal: Negative for abdominal pain, nausea and vomiting.  Musculoskeletal: Positive for arthralgias.  Neurological:  Negative for headaches.  All other systems reviewed and are negative.  Physical Exam Updated Vital Signs BP (!) 132/97   Pulse 67   Temp 99 F (37.2 C) (Oral)   Resp 18   Ht 5\' 6"  (1.676 m)   Wt 150 lb (68 kg)   SpO2 100%   BMI 24.21 kg/m   Physical Exam  Constitutional: He is oriented to person, place, and time. He appears well-developed and well-nourished. No distress.  HENT:  Head: Normocephalic and atraumatic.  Eyes: Conjunctivae are normal.  Neck: Normal range of motion. Neck supple.  Cardiovascular: Normal rate and regular rhythm.   Pulmonary/Chest: Effort normal and breath sounds normal.  Abdominal: Soft. Bowel sounds are normal.  Musculoskeletal: Normal range of motion. He exhibits no deformity.  Right shoulder has mildly increased pain with ROM. No deformities noted. No obvious swelling.  Neurological: He is alert and oriented to person, place, and time.  Skin: Skin is warm and dry.  Psychiatric: He has a normal mood and affect.  Nursing note and vitals reviewed.   ED Treatments / Results  DIAGNOSTIC STUDIES: Oxygen Saturation is 100% on RA, nl by my interpretation.    COORDINATION OF CARE: 7:04 PM Discussed treatment plan with pt at bedside and pt agreed to plan.  8:25 PM Pt resting comfortably in room. Updated him on imaging results, which are non-significant. Will d/c home. He agrees with plan.   Labs (all labs ordered are listed, but only abnormal results are displayed) Labs Reviewed - No  data to display  EKG  EKG Interpretation None       Radiology Dg Shoulder Right  Result Date: 07/29/2016 CLINICAL DATA:  Right shoulder pain EXAM: RIGHT SHOULDER - 2+ VIEW COMPARISON:  None. FINDINGS: There is no evidence of fracture or dislocation. There is no evidence of arthropathy or other focal bone abnormality. Soft tissues are unremarkable. IMPRESSION: Negative. Electronically Signed   By: Marlan Palau M.D.   On: 07/29/2016 20:18     Procedures Procedures (including critical care time)  Medications Ordered in ED Medications - No data to display   Initial Impression / Assessment and Plan / ED Course  I have reviewed the triage vital signs and the nursing notes.  Pertinent labs & imaging results that were available during my care of the patient were reviewed by me and considered in my medical decision making (see chart for details).  Patient X-Ray negative for obvious fracture or dislocation.  Pt advised to follow up with orthopedics. Conservative therapy recommended and discussed. Patient will be discharged home & is agreeable with above plan. Returns precautions discussed. Pt appears safe for discharge.        Final Clinical Impressions(s) / ED Diagnoses   Final diagnoses:  Chronic right shoulder pain    New Prescriptions Discharge Medication List as of 07/29/2016  8:29 PM    START taking these medications   Details  naproxen (NAPROSYN) 500 MG tablet Take 1 tablet (500 mg total) by mouth 2 (two) times daily., Starting Tue 07/29/2016, Print       I personally performed the services described in this documentation, which was scribed in my presence. The recorded information has been reviewed and is accurate.     John Morn, NP 07/30/16 John Kane    Jacalyn Lefevre, MD 07/30/16 1710

## 2016-07-29 NOTE — ED Notes (Signed)
Pt stable, understands discharge instructions, and reasons for return.   

## 2016-07-29 NOTE — ED Triage Notes (Signed)
Pt presents with 1 year h/o R shoulder and upper arm pain.  Pt denies any injury, reports he awoke with pain.  Pt reports the pain has worsened, reports a popping with movement.

## 2016-08-07 ENCOUNTER — Emergency Department (HOSPITAL_COMMUNITY)
Admission: EM | Admit: 2016-08-07 | Discharge: 2016-08-07 | Disposition: A | Discharge status: Home / Self Care | Payer: Self-pay | Attending: Emergency Medicine | Admitting: Emergency Medicine

## 2016-08-07 ENCOUNTER — Encounter (HOSPITAL_COMMUNITY): Payer: Self-pay | Admitting: Nurse Practitioner

## 2016-08-07 ENCOUNTER — Emergency Department (HOSPITAL_COMMUNITY): Payer: Self-pay

## 2016-08-07 DIAGNOSIS — R42 Dizziness and giddiness: Secondary | ICD-10-CM | POA: Insufficient documentation

## 2016-08-07 DIAGNOSIS — Z79899 Other long term (current) drug therapy: Secondary | ICD-10-CM | POA: Insufficient documentation

## 2016-08-07 DIAGNOSIS — F17210 Nicotine dependence, cigarettes, uncomplicated: Secondary | ICD-10-CM | POA: Insufficient documentation

## 2016-08-07 LAB — BASIC METABOLIC PANEL
Anion gap: 10 (ref 5–15)
BUN: 10 mg/dL (ref 6–20)
CALCIUM: 9 mg/dL (ref 8.9–10.3)
CHLORIDE: 104 mmol/L (ref 101–111)
CO2: 23 mmol/L (ref 22–32)
CREATININE: 1.11 mg/dL (ref 0.61–1.24)
GFR calc non Af Amer: >60 mL/min (ref >60)
Glucose, Bld: 87 mg/dL (ref 65–99)
Potassium: 4 mmol/L (ref 3.5–5.1)
SODIUM: 137 mmol/L (ref 135–145)

## 2016-08-07 LAB — I-STAT TROPONIN, ED: TROPONIN I, POC: 0 ng/mL (ref 0.00–0.08)

## 2016-08-07 LAB — CBC
HCT: 40.4 % (ref 39.0–52.0)
Hemoglobin: 13.4 g/dL (ref 13.0–17.0)
MCH: 30.7 pg (ref 26.0–34.0)
MCHC: 33.2 g/dL (ref 30.0–36.0)
MCV: 92.4 fL (ref 78.0–100.0)
PLATELETS: 274 10*3/uL (ref 150–400)
RBC: 4.37 MIL/uL (ref 4.22–5.81)
RDW: 13.2 % (ref 11.5–15.5)
WBC: 6.2 10*3/uL (ref 4.0–10.5)

## 2016-08-07 NOTE — ED Provider Notes (Signed)
[facility]-EMERGENCY DEPT Provider Note   CSN: [id number] Arrival date & time: 08/07/16  1718  By signing my name below, I, John Kane, attest that this documentation has been prepared under the direction and in the presence of John Dibbles, MD. Electronically Signed: Modena Kane, Scribe. 08/07/2016. 7:30 PM.  History   Chief Complaint Chief Complaint  Patient presents with  . Dizziness   The history is provided by the patient. No language interpreter was used.   HPI Comments: John Kane is a 36 y.o. male who presents to the Emergency Department complaining of intermittent moderate dizziness that started about a month ago. He describes the dizziness as a lightheaded sensation. No modifying factors. He reports associated SOB and chest pain. Denies any hx of PE/DVT, vomiting, diarrhea, blood in stool, or other complaints at this time.symptoms seem to come and go. He is currently not having any trouble with any chest pain or shortness of breath.  History reviewed. No pertinent past medical history.  There are no active problems to display for this patient.   Past Surgical History:  Procedure Laterality Date  . CYST EXCISION  2011   throat       Home Medications    Prior to Admission medications   Medication Sig Start Date End Date Taking? Authorizing Provider  naproxen (NAPROSYN) 500 MG tablet Take 1 tablet (500 mg total) by mouth 2 (two) times daily. 07/29/16   John Morn, NP  sertraline (ZOLOFT) 50 MG tablet Take 50 mg by mouth daily.    [provider]    Family History History reviewed. No pertinent family history.  Social History Social History  Substance Use Topics  . Smoking status: Current Every Day Smoker    Packs/day: 1.00    Types: Cigarettes  . Smokeless tobacco: Never Used  . Alcohol use Yes     Comment: q week     Allergies   Patient has no known allergies.   Review of Systems Review of Systems  Respiratory: Positive for shortness of  breath.   Cardiovascular: Positive for chest pain.  Gastrointestinal: Negative for blood in stool, diarrhea and vomiting.  Neurological: Positive for dizziness and light-headedness.  All other systems reviewed and are negative.    Physical Exam Updated Vital Signs BP 121/86   Pulse 66   Temp 98.1 F (36.7 C) (Oral)   Resp 16   SpO2 98%   Physical Exam  Constitutional: He appears well-developed and well-nourished. No distress.  HENT:  Head: Normocephalic and atraumatic.  Right Ear: External ear normal.  Left Ear: External ear normal.  Eyes: Conjunctivae are normal. Right eye exhibits no discharge. Left eye exhibits no discharge. No scleral icterus.  Neck: Neck supple. No tracheal deviation present.  Cardiovascular: Normal rate, regular rhythm and intact distal pulses.   Pulmonary/Chest: Effort normal and breath sounds normal. No stridor. No respiratory distress. He has no wheezes. He has no rales.  Abdominal: Soft. Bowel sounds are normal. He exhibits no distension. There is no tenderness. There is no rebound and no guarding.  Musculoskeletal: He exhibits no edema or tenderness.  Neurological: He is alert. He has normal strength. No cranial nerve deficit (no facial droop, extraocular movements intact, no slurred speech) or sensory deficit. He exhibits normal muscle tone. He displays no seizure activity. Coordination normal.  Skin: Skin is warm and dry. No rash noted.  Psychiatric: He has a normal mood and affect.  Nursing note and vitals reviewed.    ED Treatments /  Results  DIAGNOSTIC STUDIES: Oxygen Saturation is 98% on RA, normal by my interpretation.    COORDINATION OF CARE: 7:35 PM- Pt advised of plan for treatment and pt agrees.  Labs (all labs ordered are listed, but only abnormal results are displayed) Labs Reviewed  BASIC METABOLIC PANEL  CBC  I-STAT TROPOININ, ED    EKG  EKG Interpretation  Date/Time:  Thursday Aug 07 2016 17:24:53 EDT Ventricular Rate:   65 PR Interval:  224 QRS Duration: 74 QT Interval:  350 QTC Calculation: 364 R Axis:   42 Text Interpretation:  Sinus rhythm with 1st degree A-V block Otherwise normal ECG left anterior fascicular block no present on current ECG compared to last Confirmed by John DibblesKnapp, Truitt Cruey [phone]([id number]) on 08/07/2016 7:02:21 PM       Radiology Dg Chest 2 View  Result Date: 08/07/2016 CLINICAL DATA:  Chest pain shortness of breath for 1 day. EXAM: CHEST  2 VIEW COMPARISON:  03/04/2015 FINDINGS: The lungs are clear wiithout focal pneumonia, edema, pneumothorax or pleural effusion. Blunting left costophrenic angle is stable. The cardiopericardial silhouette is within normal limits for size. The visualized bony structures of the thorax are intact. IMPRESSION: No active cardiopulmonary disease. Electronically Signed   By: Kennith CenterEric  Mansell M.D.   On: 08/07/2016 20:04    Procedures Procedures (including critical care time)  Medications Ordered in ED Medications - No data to display   Initial Impression / Assessment and Plan / ED Course  I have reviewed the triage vital signs and the nursing notes.  Pertinent labs & imaging results that were available during my care of the patient were reviewed by me and considered in my medical decision making (see chart for details).   patient presented to the emergency room with complaints of dizziness that are primarily lightheadedness. He had some vague complaints of chest discomfort and dyspnea at times but they're not always related to the dizziness.He does not describe any vertigo.  Patient's exam is reassuring in the emergency room. He has normal vital signs.his laboratory tests and x-rays are normal. Recommended outpatient follow-up primary care doctor. Could consider doing an outpatient stress test. Final Clinical Impressions(s) / ED Diagnoses   Final diagnoses:  Dizziness    New Prescriptions New Prescriptions   No medications on file   I personally performed the  services described in this documentation, which was scribed in my presence.  The recorded information has been reviewed and is accurate.    John DibblesKnapp, John Dani, MD 08/07/16 2044

## 2016-08-07 NOTE — ED Triage Notes (Signed)
Pt presents with c/o dizziness. His symptoms began about a month ago. He reports episodes of dizziness with the feeling that he might pass out. The episodes are intermittent and do not seem to be related to any activity. He has felt short of breath at times. He denies any syncope, fevers, pain, cough, nausea, vomiting. The episodes are relieved with sitting and resting.

## 2016-08-07 NOTE — Discharge Instructions (Signed)
The tests in the ED today were reassuring. Drink plenty of fluids, follow-up with your primary care doctor to discuss additional evaluation if the symptoms persist.

## 2016-08-28 ENCOUNTER — Emergency Department (HOSPITAL_COMMUNITY)
Admission: EM | Admit: 2016-08-28 | Discharge: 2016-08-28 | Disposition: A | Discharge status: Home / Self Care | Payer: Self-pay | Attending: Emergency Medicine | Admitting: Emergency Medicine

## 2016-08-28 ENCOUNTER — Encounter (HOSPITAL_COMMUNITY): Payer: Self-pay

## 2016-08-28 DIAGNOSIS — F17210 Nicotine dependence, cigarettes, uncomplicated: Secondary | ICD-10-CM | POA: Insufficient documentation

## 2016-08-28 DIAGNOSIS — R519 Headache, unspecified: Secondary | ICD-10-CM

## 2016-08-28 DIAGNOSIS — R51 Headache: Secondary | ICD-10-CM | POA: Insufficient documentation

## 2016-08-28 NOTE — ED Notes (Signed)
Patient back from the hospital.  Placed back in queue for triage.

## 2016-08-28 NOTE — ED Notes (Signed)
Patient left before receiving discharge paperwork

## 2016-08-28 NOTE — ED Provider Notes (Signed)
[facility]-EM[facility]Y DEPT Provider Note   CSN: [id number] Arrival date & time: 08/28/16  1546  By signing my name below, I, Doreatha Martin, attest that this documentation has been prepared under the direction and in the presence of [employer], PA-C. Electronically Signed: Doreatha Martin, ED Scribe. 08/28/16. 5:40 PM.    History   Chief Complaint Chief Complaint  Patient presents with  . Headache    HPI John Kane is a 36 y.o. male who presents to the Emergency Department complaining of a moderate right-sided HA and "pressure" that began this morning upon waking and lasted a few minutes. He reports his HA has resolved, but he has residual mild, improving dizziness. Pt states he gets HAs occasionally, but they are not similar to this episode. He is not currently working, and does not have any exposure to chemicals at home. Pt states his dizziness is worsened with positional changes and activity. He is a current smoker. He denies extremity numbness/tingling/weakness, sinus pressure, rhinorrhea.     The history is provided by the patient. No language interpreter was used.    History reviewed. No pertinent past medical history.  There are no active problems to display for this patient.   Past Surgical History:  Procedure Laterality Date  . CYST EXCISION  2011   throat       Home Medications    Prior to Admission medications   Medication Sig Start Date End Date Taking? Authorizing Provider  naproxen (NAPROSYN) 500 MG tablet Take 1 tablet (500 mg total) by mouth 2 (two) times daily. 07/29/16   [name], NP  sertraline (ZOLOFT) 50 MG tablet Take 50 mg by mouth daily.    [provider]    Family History No family history on file.  Social History Social History  Substance Use Topics  . Smoking status: Current Every Day Smoker    Packs/day: 1.00    Types: Cigarettes  . Smokeless tobacco: Never Used  . Alcohol use Yes     Comment: q week     Allergies     Patient has no known allergies.   Review of Systems Review of Systems  HENT: Negative for rhinorrhea and sinus pressure.   Neurological: Positive for dizziness and headaches (resolved). Negative for weakness and numbness.  All other systems reviewed and are negative.    Physical Exam Updated Vital Signs BP 123/75 (BP Location: Right Arm)   Pulse 85   Temp 98 F (36.7 C) (Oral)   Resp 17   SpO2 100%   Physical Exam  Constitutional: He is oriented to person, place, and time. He appears well-developed and well-nourished. No distress.  HENT:  Head: Normocephalic.  Eyes: Conjunctivae and EOM are normal. Pupils are equal, round, and reactive to light. Right eye exhibits no discharge. Left eye exhibits no discharge. No scleral icterus.  Neck: Normal range of motion. Neck supple. No JVD present.  Pulmonary/Chest: No stridor.  Musculoskeletal: Normal range of motion. He exhibits no edema or tenderness.  Lymphadenopathy:    He has no cervical adenopathy.  Neurological: He is alert and oriented to person, place, and time. He has normal strength. He displays no atrophy and no tremor. No cranial nerve deficit or sensory deficit. He exhibits normal muscle tone. He displays a negative Romberg sign. He displays no seizure activity. Coordination and gait normal. GCS eye subscore is 4. GCS verbal subscore is 5. GCS motor subscore is 6.  Reflex Scores:      Patellar reflexes are  2+ on the right side and 2+ on the left side. Cranial nerves 2-12 grossly intact. Normal finger to nose testing.    Skin: He is not diaphoretic.  Nursing note and vitals reviewed.    ED Treatments / Results   DIAGNOSTIC STUDIES: Oxygen Saturation is 100% on RA, normal by my interpretation.    COORDINATION OF CARE: 5:37 PM Discussed treatment plan with pt at bedside which includes supportive care, adequate hydration and pt agreed to plan.   Procedures Procedures (including critical care time)  Medications  Ordered in ED Medications - No data to display   Initial Impression / Assessment and Plan / ED Course  I have reviewed the triage vital signs and the nursing notes.     Labs: none  Imaging: none  Consults: none  Therapeutics: none  Discharge Meds: none  Assessment/Plan: 36 year old male presents today with complaints of an episode of headache and dizziness.  He reports the headache was very brief and resolved spontaneously.  He had some dizziness mostly with ambulation, now resolved.  Patient is asymptomatic throughout my evaluation.  He has no red flags or concerning signs or symptoms at this time.  Patient will be referred to neurology if headaches continue to return, and given strict return precautions.  Patient immediately left the room after my evaluation and did not receive his discharge information.     Final Clinical Impressions(s) / ED Diagnoses   Final diagnoses:  Nonintractable headache, unspecified chronicity pattern, unspecified headache type    New Prescriptions New Prescriptions   No medications on file    I personally performed the services described in this documentation, which was scribed in my presence. The recorded information has been reviewed and is accurate.   Eyvonne MechanicHedges, Avarie Tavano, PA-C 08/28/16 1753    Azalia Bilisampos, Kevin, MD 08/29/16 [phone]

## 2016-08-28 NOTE — ED Triage Notes (Signed)
Pt reports he woke up this morning with pain to the right side of his head and eye that lasted about 20 minutes. He reports the head pain has went away but he feels dizzy. Pt speaking in clear complete sentences, NAD.

## 2016-08-28 NOTE — ED Notes (Signed)
Name called for triage 3 times very loudly by this RN. No response from waiting room. Pt not seen. This RN asked registration and was told the patient asked for directions to medical records after checking in.

## 2016-09-02 ENCOUNTER — Emergency Department (HOSPITAL_COMMUNITY)
Admission: EM | Admit: 2016-09-02 | Discharge: 2016-09-02 | Disposition: A | Discharge status: Home / Self Care | Payer: Self-pay | Attending: Emergency Medicine | Admitting: Emergency Medicine

## 2016-09-02 ENCOUNTER — Encounter (HOSPITAL_COMMUNITY): Payer: Self-pay | Admitting: Emergency Medicine

## 2016-09-02 DIAGNOSIS — K625 Hemorrhage of anus and rectum: Secondary | ICD-10-CM | POA: Insufficient documentation

## 2016-09-02 DIAGNOSIS — Z79899 Other long term (current) drug therapy: Secondary | ICD-10-CM | POA: Insufficient documentation

## 2016-09-02 DIAGNOSIS — F17210 Nicotine dependence, cigarettes, uncomplicated: Secondary | ICD-10-CM | POA: Insufficient documentation

## 2016-09-02 LAB — CBC
HEMATOCRIT: 41.6 % (ref 39.0–52.0)
HEMOGLOBIN: 14.1 g/dL (ref 13.0–17.0)
MCH: 31.1 pg (ref 26.0–34.0)
MCHC: 33.9 g/dL (ref 30.0–36.0)
MCV: 91.6 fL (ref 78.0–100.0)
Platelets: 260 10*3/uL (ref 150–400)
RBC: 4.54 MIL/uL (ref 4.22–5.81)
RDW: 13 % (ref 11.5–15.5)
WBC: 5.9 10*3/uL (ref 4.0–10.5)

## 2016-09-02 LAB — URINALYSIS, ROUTINE W REFLEX MICROSCOPIC
Bacteria, UA: NONE SEEN
Bilirubin Urine: NEGATIVE
Glucose, UA: NEGATIVE mg/dL
Ketones, ur: NEGATIVE mg/dL
LEUKOCYTES UA: NEGATIVE
Nitrite: NEGATIVE
PH: 7 (ref 5.0–8.0)
Protein, ur: NEGATIVE mg/dL
SQUAMOUS EPITHELIAL / LPF: NONE SEEN
Specific Gravity, Urine: 1.006 (ref 1.005–1.030)

## 2016-09-02 LAB — COMPREHENSIVE METABOLIC PANEL
ALBUMIN: 4.5 g/dL (ref 3.5–5.0)
ALK PHOS: 48 U/L (ref 38–126)
ALT: 18 U/L (ref 17–63)
ANION GAP: 7 (ref 5–15)
AST: 19 U/L (ref 15–41)
BUN: 9 mg/dL (ref 6–20)
CALCIUM: 9.2 mg/dL (ref 8.9–10.3)
CHLORIDE: 105 mmol/L (ref 101–111)
CO2: 26 mmol/L (ref 22–32)
Creatinine, Ser: 1.05 mg/dL (ref 0.61–1.24)
GFR calc non Af Amer: >60 mL/min (ref >60)
GLUCOSE: 93 mg/dL (ref 65–99)
POTASSIUM: 4.3 mmol/L (ref 3.5–5.1)
SODIUM: 138 mmol/L (ref 135–145)
Total Bilirubin: 0.4 mg/dL (ref 0.3–1.2)
Total Protein: 6.9 g/dL (ref 6.5–8.1)

## 2016-09-02 LAB — TYPE AND SCREEN
ABO/RH(D): A POS
Antibody Screen: NEGATIVE

## 2016-09-02 LAB — ABO/RH: ABO/RH(D): A POS

## 2016-09-02 LAB — POC OCCULT BLOOD, ED: FECAL OCCULT BLD: NEGATIVE

## 2016-09-02 NOTE — ED Provider Notes (Signed)
[facility]-EMERGENCY DEPT Provider Note   CSN: [id number] Arrival date & time: 09/02/16  1322     History   Chief Complaint Chief Complaint  Patient presents with  . Rectal Bleeding    HPI John Kane is a 36 y.o. male.  HPI  Patient presents to ED for complaints of blood in his stool last night and this morning. He reports loose stools with bright red blood mixed in. Denies any previous history of these symptoms. Denies taking any medication for his diarrhea. Reports previous history of hemorrhoids. Reports some blood in urine yesterday. He denies any abdominal pain, nausea, vomiting, dizziness, lightheadedness, testicular pain, penile discharge.   History reviewed. No pertinent past medical history.  There are no active problems to display for this patient.   Past Surgical History:  Procedure Laterality Date  . CYST EXCISION  2011   throat       Home Medications    Prior to Admission medications   Medication Sig Start Date End Date Taking? Authorizing Provider  naproxen (NAPROSYN) 500 MG tablet Take 1 tablet (500 mg total) by mouth 2 (two) times daily. 07/29/16  Yes Felicie MornSmith, David, NP  sertraline (ZOLOFT) 50 MG tablet Take 50 mg by mouth daily.   Yes [provider]    Family History No family history on file.  Social History Social History  Substance Use Topics  . Smoking status: Current Every Day Smoker    Packs/day: 1.00    Types: Cigarettes  . Smokeless tobacco: Never Used  . Alcohol use Yes     Comment: q week     Allergies   Patient has no known allergies.   Review of Systems Review of Systems  Constitutional: Negative for appetite change, chills and fever.  HENT: Negative for ear pain, rhinorrhea, sneezing and sore throat.   Eyes: Negative for photophobia and visual disturbance.  Respiratory: Negative for cough, chest tightness, shortness of breath and wheezing.   Cardiovascular: Negative for chest pain and palpitations.    Gastrointestinal: Positive for blood in stool. Negative for abdominal pain, constipation, diarrhea, nausea and vomiting.  Genitourinary: Negative for dysuria, hematuria, testicular pain and urgency.  Musculoskeletal: Negative for myalgias.  Skin: Negative for rash.  Neurological: Negative for dizziness, weakness and light-headedness.     Physical Exam Updated Vital Signs BP 123/83 (BP Location: Left Arm)   Pulse 69   Temp 98.1 F (36.7 C) (Oral)   Resp 20   SpO2 97%   Physical Exam  Constitutional: He appears well-developed and well-nourished. No distress.  HENT:  Head: Normocephalic and atraumatic.  Nose: Nose normal.  Eyes: Conjunctivae and EOM are normal. Left eye exhibits no discharge. No scleral icterus.  Neck: Normal range of motion. Neck supple.  Cardiovascular: Normal rate, regular rhythm, normal heart sounds and intact distal pulses.  Exam reveals no gallop and no friction rub.   No murmur heard. Pulmonary/Chest: Effort normal and breath sounds normal. No respiratory distress.  Abdominal: Soft. Bowel sounds are normal. He exhibits no distension. There is no tenderness. There is no guarding.  Genitourinary: Rectum normal. Rectal exam shows no external hemorrhoid, no internal hemorrhoid and guaiac negative stool.  Genitourinary Comments: Chaperone present for rectal exam.  Musculoskeletal: Normal range of motion. He exhibits no edema.  Neurological: He is alert. He exhibits normal muscle tone. Coordination normal.  Skin: Skin is warm and dry. No rash noted.  Psychiatric: He has a normal mood and affect.  Nursing note and vitals reviewed.  ED Treatments / Results  Labs (all labs ordered are listed, but only abnormal results are displayed) Labs Reviewed  URINALYSIS, ROUTINE W REFLEX MICROSCOPIC - Abnormal; Notable for the following:       Result Value   Color, Urine STRAW (*)    Hgb urine dipstick MODERATE (*)    All other components within normal limits   COMPREHENSIVE METABOLIC PANEL  CBC  POC OCCULT BLOOD, ED  TYPE AND SCREEN  ABO/RH    EKG  EKG Interpretation None       Radiology No results found.  Procedures Procedures (including critical care time)  Medications Ordered in ED Medications - No data to display   Initial Impression / Assessment and Plan / ED Course  I have reviewed the triage vital signs and the nursing notes.  Pertinent labs & imaging results that were available during my care of the patient were reviewed by me and considered in my medical decision making (see chart for details).     Patient presents to ED for evaluation of 2 episodes of diarrhea with blood in his stool and blood in urine. He denies any abdominal pain, vomiting or nausea. On physical exam he is not tender to palpation in the abdomen. Rectal exam revealed no hemorrhoid, fissure or other tear. He has normal anal sphincter tone. He is afebrile with no history of fever. He denies previous history of these symptoms. CBC, CMP unremarkable. Hemoglobin and hematocrit normal. Stool returned as Hemoccult negative. UA showed moderate hemoglobin. Patient denies any other penile or testicular complaints. His symptoms could be due to possible colitis that could've resolved on its own. I informed patient that his stool was Hemoccult negative today and because he does not have any abdominal pain, no significant lab findings or fever there is no need for further imaging at this time. I discussed the risks and benefits with him for further imaging. I advised him to follow up with his PCP for further evaluation if he continues to have blood in his stool or urine, and told him his symptoms could be caused by a rate of possible reasons. He agreed to plan and appears stable for discharge at this time. Should return precautions given.  Final Clinical Impressions(s) / ED Diagnoses   Final diagnoses:  Rectal bleeding    New Prescriptions Discharge Medication List  as of 09/02/2016  9:08 PM       Dietrich Pates, PA-C 09/03/16 1615    Linwood Dibbles, MD 09/05/16 [phone]

## 2016-09-02 NOTE — ED Triage Notes (Signed)
Per pt, states he noticed blood in his stool last night and this am-no abdominal pain, no dizziness

## 2016-09-02 NOTE — Discharge Instructions (Signed)
Follow up with PCP for further evaluation. Return to ED for severe abdominal pain, lightheadedness, loss of consciousness, increased blood in stool, fever, increased vomiting.

## 2018-03-06 IMAGING — DX DG CHEST 2V
2 series · 2 of 2 positions shown · non-contrast
Comparison: 03/04/2015

CLINICAL DATA: Chest pain shortness of breath for 1 day.

EXAM:
CHEST  2 VIEW

[w chest pa]
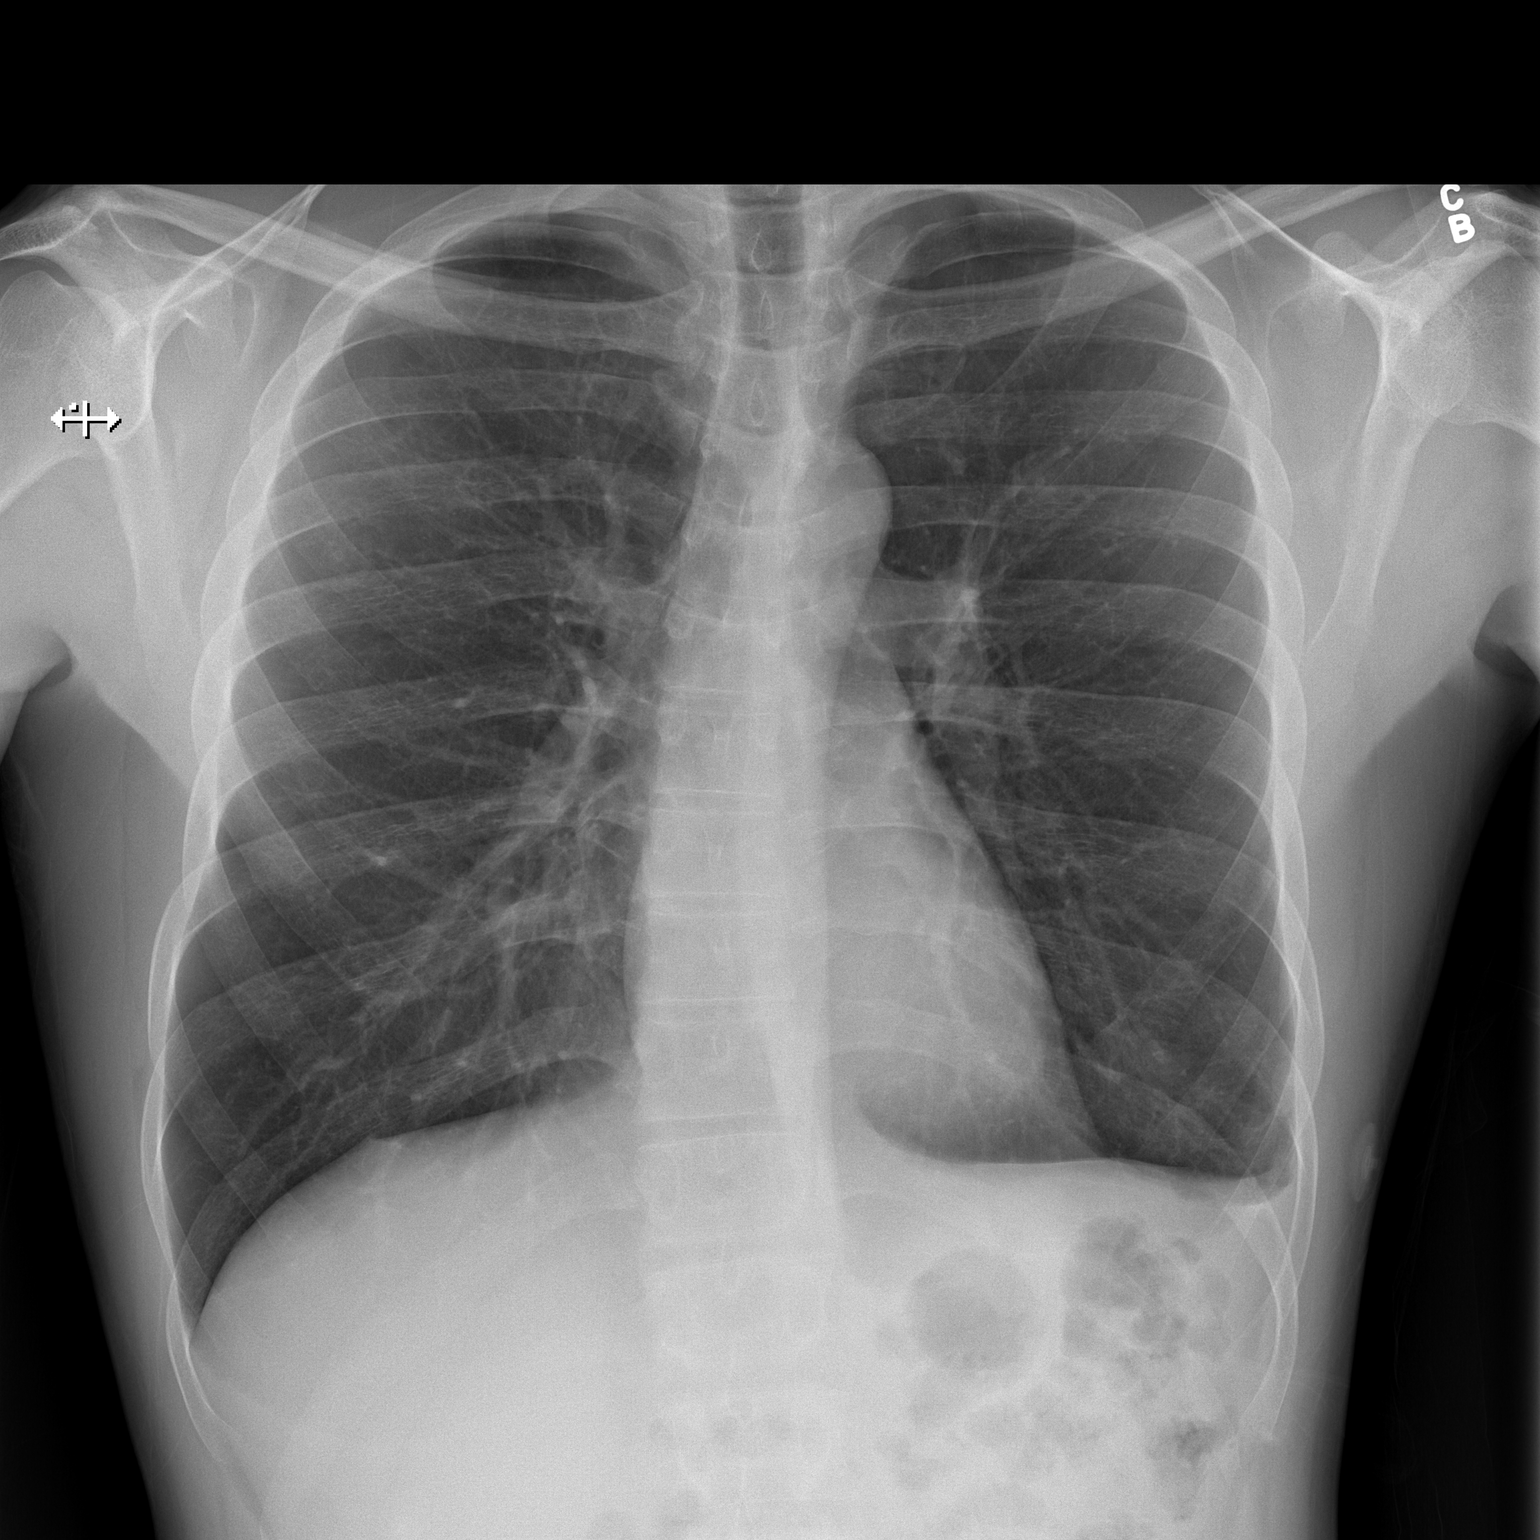

[w chest lat]
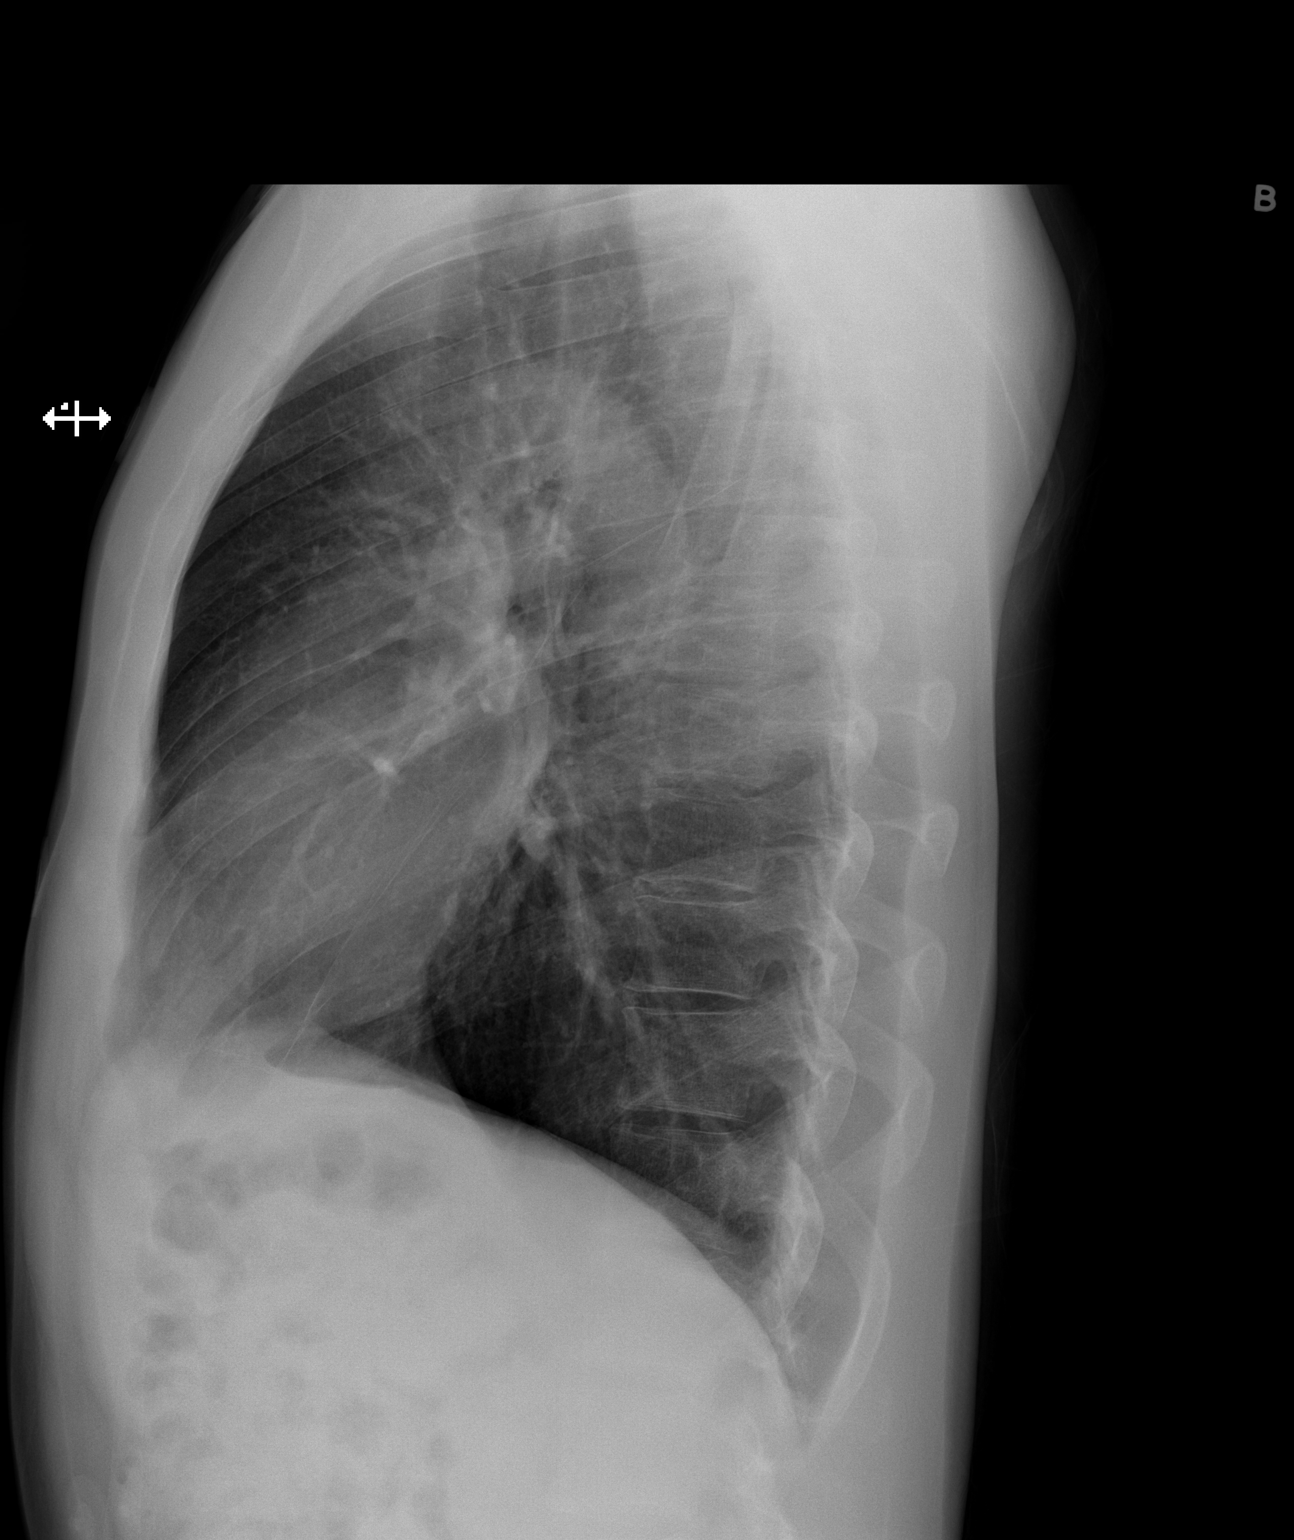

[2 of 2 positions shown; findings below may reference images not displayed]

FINDINGS: The lungs are clear wiithout focal pneumonia, edema, pneumothorax or
pleural effusion. Blunting left costophrenic angle is stable. The
cardiopericardial silhouette is within normal limits for size. The
visualized bony structures of the thorax are intact.
IMPRESSION: No active cardiopulmonary disease.

## 2023-04-23 ENCOUNTER — Emergency Department (HOSPITAL_COMMUNITY)
Admission: EM | Admit: 2023-04-23 | Discharge: 2023-04-30 | Disposition: A | Discharge status: Home / Self Care | Payer: Self-pay | Attending: Emergency Medicine | Admitting: Emergency Medicine

## 2023-04-23 ENCOUNTER — Ambulatory Visit (HOSPITAL_COMMUNITY)
Admission: EM | Admit: 2023-04-23 | Discharge: 2023-04-23 | Disposition: A | Discharge status: Other Acute Inpatient Hospital | Payer: No Payment, Other | Attending: Nurse Practitioner | Admitting: Nurse Practitioner

## 2023-04-23 ENCOUNTER — Other Ambulatory Visit: Payer: Self-pay

## 2023-04-23 DIAGNOSIS — F29 Unspecified psychosis not due to a substance or known physiological condition: Secondary | ICD-10-CM | POA: Insufficient documentation

## 2023-04-23 DIAGNOSIS — Z5900 Homelessness unspecified: Secondary | ICD-10-CM | POA: Insufficient documentation

## 2023-04-23 DIAGNOSIS — F22 Delusional disorders: Secondary | ICD-10-CM | POA: Insufficient documentation

## 2023-04-23 LAB — CBC WITH DIFFERENTIAL/PLATELET
Abs Immature Granulocytes: 0.01 10*3/uL (ref 0.00–0.07)
Basophils Absolute: 0 10*3/uL (ref 0.0–0.1)
Basophils Relative: 0 %
Eosinophils Absolute: 0.1 10*3/uL (ref 0.0–0.5)
Eosinophils Relative: 2 %
HCT: 46.2 % (ref 39.0–52.0)
Hemoglobin: 14.9 g/dL (ref 13.0–17.0)
Immature Granulocytes: 0 %
Lymphocytes Relative: 34 %
Lymphs Abs: 1.7 10*3/uL (ref 0.7–4.0)
MCH: 30.7 pg (ref 26.0–34.0)
MCHC: 32.3 g/dL (ref 30.0–36.0)
MCV: 95.1 fL (ref 80.0–100.0)
Monocytes Absolute: 0.6 10*3/uL (ref 0.1–1.0)
Monocytes Relative: 13 %
Neutro Abs: 2.5 10*3/uL (ref 1.7–7.7)
Neutrophils Relative %: 51 %
Platelets: 260 10*3/uL (ref 150–400)
RBC: 4.86 MIL/uL (ref 4.22–5.81)
RDW: 13.2 % (ref 11.5–15.5)
WBC: 4.9 10*3/uL (ref 4.0–10.5)
nRBC: 0 % (ref 0.0–0.2)

## 2023-04-23 LAB — COMPREHENSIVE METABOLIC PANEL
ALT: 29 U/L (ref 0–44)
AST: 50 U/L — ABNORMAL HIGH (ref 15–41)
Albumin: 4.7 g/dL (ref 3.5–5.0)
Alkaline Phosphatase: 44 U/L (ref 38–126)
Anion gap: 8 (ref 5–15)
BUN: 20 mg/dL (ref 6–20)
CO2: 27 mmol/L (ref 22–32)
Calcium: 9.2 mg/dL (ref 8.9–10.3)
Chloride: 100 mmol/L (ref 98–111)
Creatinine, Ser: 1.13 mg/dL (ref 0.61–1.24)
GFR, Estimated: >60 mL/min (ref >60)
Glucose, Bld: 102 mg/dL — ABNORMAL HIGH (ref 70–99)
Potassium: 3.9 mmol/L (ref 3.5–5.1)
Sodium: 135 mmol/L (ref 135–145)
Total Bilirubin: 0.8 mg/dL (ref 0.0–1.2)
Total Protein: 7.7 g/dL (ref 6.5–8.1)

## 2023-04-23 LAB — ETHANOL: Alcohol, Ethyl (B): <10 mg/dL (ref <10)

## 2023-04-23 MED ORDER — OLANZAPINE 10 MG IM SOLR: 5.0000 mg | Freq: Three times a day (TID) | INTRAMUSCULAR | Status: DC | PRN

## 2023-04-23 MED ORDER — ACETAMINOPHEN 325 MG PO TABS: 650.0000 mg | ORAL_TABLET | Freq: Four times a day (QID) | ORAL | Status: DC | PRN

## 2023-04-23 MED ORDER — OLANZAPINE 5 MG PO TBDP: 5.0000 mg | ORAL_TABLET | Freq: Three times a day (TID) | ORAL | Status: DC | PRN

## 2023-04-23 MED ORDER — TRAZODONE HCL 50 MG PO TABS: 50.0000 mg | ORAL_TABLET | Freq: Every evening | ORAL | Status: DC | PRN

## 2023-04-23 MED ORDER — NICOTINE 21 MG/24HR TD PT24: 21.0000 mg | MEDICATED_PATCH | Freq: Every day | TRANSDERMAL | Status: DC

## 2023-04-23 MED ORDER — MAGNESIUM HYDROXIDE 400 MG/5ML PO SUSP: 30.0000 mL | Freq: Every day | ORAL | Status: DC | PRN

## 2023-04-23 MED ORDER — OLANZAPINE 10 MG IM SOLR: 10.0000 mg | Freq: Three times a day (TID) | INTRAMUSCULAR | Status: DC | PRN

## 2023-04-23 MED ORDER — ALUM & MAG HYDROXIDE-SIMETH 200-200-20 MG/5ML PO SUSP: 30.0000 mL | ORAL | Status: DC | PRN

## 2023-04-23 NOTE — BH Assessment (Incomplete)
Comprehensive Clinical Assessment (CCA) Note  04/23/2023 John Kane [id number]  Disposition: Per && patient does/does not meet inpatient criteria.  &&& is recommended.  Disposition SW to pursue appropriate inpatient options.  The patient demonstrates the following risk factors for suicide: Chronic risk factors for suicide include: {Chronic Risk Factors for WUJWJXB:14782956}. Acute risk factors for suicide include: {Acute Risk Factors for OZHYQMV:78469629}. Protective factors for this patient include: {Protective Factors for Suicide BMWU:13244010}. Considering these factors, the overall suicide risk at this point appears to be {Desc; low/moderate/high:110033}. Patient {ACTION; IS/IS UVO:53664403} appropriate for outpatient follow up.  Patient is a 43 year old male with an unknown psychiatric history who presents voluntarily to [facility] for assessment.  Patient presents via BHRT. Patient states that he was caught at an elementary school performing religious acts. Patient states that he was told by God to go there and touch the ground and trees. Patient reports he is homeless "for the fifth time."  Patient refuses to engage in assessment, and instead informs staff that "the holy spirit told me that you know I have no mental illness."  Patient will not engage further in an assessment.     Chief Complaint:  Chief Complaint  Patient presents with   Homeless   Evaluation   Visit Diagnosis: Psychosis Unspecified    CCA Screening, Triage and Referral (STR)  Patient Reported Information How did you hear about [place]? Legal System  What Is the Reason for Your Visit/Call Today? John Kane presents to [facility] voluntarily via BHRT. Pt states that he was caught at an elementary school performing religious acts. Pt states that he was told by God to go there and touch the ground and trees. Pt states that he is homeless and this is the fifth time. Pt  How Long Has This Been Causing You  Problems? > than 6 months  What Do You Feel Would Help You the Most Today? Housing Assistance (wants to speak with a chaplain)   Have You Recently Had Any Thoughts About Hurting Yourself? No  Are You Planning to Commit Suicide/Harm Yourself At This time? No   Flowsheet Row ED from 04/23/2023 in [facility]  C-SSRS RISK CATEGORY No Risk       Have you Recently Had Thoughts About Hurting Someone John Kane? No  Are You Planning to Harm Someone at This Time? No  Explanation: No data recorded  Have You Used Any Alcohol or Drugs in the Past 24 Hours? No  How Long Ago Did You Use Drugs or Alcohol? No data recorded What Did You Use and How Much? No data recorded  Do You Currently Have a Therapist/Psychiatrist? No  Name of Therapist/Psychiatrist:    Have You Been Recently Discharged From Any Office Practice or Programs? No  Explanation of Discharge From Practice/Program: No data recorded    CCA Screening Triage Referral Assessment Type of Contact: Face-to-Face  Telemedicine Service Delivery:   Is this Initial or Reassessment?   Date Telepsych consult ordered in [facility]:    Time Telepsych consult ordered in [facility]:    Location of Assessment: [facility]  Provider Location: [facility] Assessment [facility]   Collateral Involvement: None   Does Patient Have a Automotive engineer Guardian? No  Legal Guardian Contact Information: N/A  Copy of Legal Guardianship Form: -- (N/A)  Legal Guardian Notified of Arrival: -- (N/A)  Legal Guardian Notified of Pending Discharge: -- (N/A)  If Minor and Not Living with Parent(s), Who  has Custody? N/A  Is CPS involved or ever been involved? Never  Is APS involved or ever been involved? Never   Patient Determined To Be At Risk for Harm To Self or Others Based on Review of Patient Reported Information or Presenting Complaint? No  Method: -- (N/A, no HI)  Availability of Means: -- (N/A, no  HI)  Intent: -- (N/A, no HI)  Notification Required: -- (N/A, no HI)  Additional Information for Danger to Others Potential: -- (N/A, no HI)  Additional Comments for Danger to Others Potential: N/A, no HI  Are There Guns or Other Weapons in Your Home? No  Types of Guns/Weapons: N/A  Are These Weapons Safely Secured?                            -- (N/A)  Who Could Verify You Are Able To Have These Secured: N/A, no HI  Do You Have any Outstanding Charges, Pending Court Dates, Parole/Probation? None reported  Contacted To Inform of Risk of Harm To Self or Others: -- (N/A, no HI)    Does Patient Present under Involuntary Commitment? No    [place] of Residence: [place]   Patient Currently Receiving the Following Services: Not Receiving Services   Determination of Need: Urgent (48 hours)   Options For Referral: Inpatient Hospitalization; BH Urgent Care     CCA Biopsychosocial Patient Reported Schizophrenia/Schizoaffective Diagnosis in Past: -- (UTA, no mention of Schizophrenia on chart review)   Strengths: UTA, due to patient's acute psychotic symptoms   Mental Health Symptoms Depression:  -- (UTA, due to patient's acute psychotic symptoms)   Duration of Depressive symptoms:    Mania:  -- (UTA, due to patient's acute psychotic symptoms)   Anxiety:   -- (UTA, due to patient's acute psychotic symptoms)   Psychosis:  Delusions; Grossly disorganized or catatonic behavior; Hallucinations   Duration of Psychotic symptoms: Duration of Psychotic Symptoms: -- (UTA, due to patient's acute psychotic symptoms)   Trauma:  No data recorded  Obsessions:  -- (UTA, due to patient's acute psychotic symptoms)   Compulsions:  -- (UTA, due to patient's acute psychotic symptoms)   Inattention:  -- (UTA, due to patient's acute psychotic symptoms)   Hyperactivity/Impulsivity:  -- (UTA, due to patient's acute psychotic symptoms)   Oppositional/Defiant Behaviors:  -- (UTA, due to  patient's acute psychotic symptoms)   Emotional Irregularity:  -- (UTA, due to patient's acute psychotic symptoms)   Other Mood/Personality Symptoms:  UTA, due to patient's acute psychotic symptoms    Mental Status Exam Appearance and self-care  Stature:  Average   Weight:  Average weight   Clothing:  Disheveled   Grooming:  Normal   Cosmetic use:  None   Posture/gait:  Rigid   Motor activity:  Restless   Sensorium  Attention:  Vigilant   Concentration:  Focuses on irrelevancies; Preoccupied   Orientation:  -- (UTA, due to patient's acute psychotic symptoms)   Recall/memory:  -- (UTA, due to patient's acute psychotic symptoms)   Affect and Mood  Affect:  Blunted   Mood:  Irritable   Relating  Eye contact:  Avoided   Facial expression:  Angry; Tense   Attitude toward examiner:  Defensive; Guarded; Irritable   Thought and Language  Speech flow: Blocked   Thought content:  Delusions; Suspicious   Preoccupation:  Religion; Ruminations   Hallucinations:  Auditory   Organization:  Passenger transport manager of  Knowledge:  Average   Intelligence:  Average   Abstraction:  -- (UTA, due to patient's acute psychotic symptoms)   Judgement:  Impaired   Reality Testing:  Distorted   Insight:  Lacking; Poor   Decision Making:  Confused; Vacilates   Social Functioning  Social Maturity:  -- (UTA, due to patient's acute psychotic symptoms)   Social Judgement:  -- (UTA, due to patient's acute psychotic symptoms)   Stress  Stressors:  Housing   Coping Ability:  Deficient supports; Exhausted   Skill Deficits:  -- (UTA, due to patient's acute psychotic symptoms)   Supports:  -- (UTA, due to patient's acute psychotic symptoms)     Religion: Religion/Spirituality Are You A Religious Person?: Yes What is Your Religious Affiliation?:  (UTA, due to patient's acute psychotic symptoms) How Might This Affect Treatment?: religious  preoccupation - sx of psychosis currently  Leisure/Recreation: Leisure / Recreation Do You Have Hobbies?:  (UTA, due to patient's acute psychotic symptoms)  Exercise/Diet: Exercise/Diet Do You Exercise?:  (UTA, due to patient's acute psychotic symptoms) Have You Gained or Lost A Significant Amount of Weight in the Past Six Months?: No Do You Follow a Special Diet?: No Do You Have Any Trouble Sleeping?:  (UTA, due to patient's acute psychotic symptoms)   CCA Employment/Education Employment/Work Situation: Employment / Work Situation Employment Situation: Unemployed Patient's Job has Been Impacted by Current Illness: No Has Patient ever Been in Equities trader?: No  Education: Education Is Patient Currently Attending School?: No Last Grade Completed:  (UTA, due to patient's acute psychotic symptoms) Did You Attend College?:  (UTA, due to patient's acute psychotic symptoms) Did You Have An Individualized Education Program (IIEP):  (UTA, due to patient's acute psychotic symptoms) Did You Have Any Difficulty At School?:  (UTA, due to patient's acute psychotic symptoms) Patient's Education Has Been Impacted by Current Illness:  (UTA, due to patient's acute psychotic symptoms)   CCA Family/Childhood History Family and Relationship History: Family history Marital status:  (UTA, due to patient's acute psychotic symptoms) Does patient have children?:  (UTA, due to patient's acute psychotic symptoms)  Childhood History:  Childhood History By whom was/is the patient raised?:  (UTA, due to patient's acute psychotic symptoms) Did patient suffer any verbal/emotional/physical/sexual abuse as a child?: No Did patient suffer from severe childhood neglect?: No Has patient ever been sexually abused/assaulted/raped as an adolescent or adult?: No Was the patient ever a victim of a crime or a disaster?: No Witnessed domestic violence?: No Has patient been affected by domestic violence as an  adult?: No       CCA Substance Use Alcohol/Drug Use: Alcohol / Drug Use Pain Medications: See MAR Prescriptions: See MAR Over the Counter: See MAR History of alcohol / drug use?:  (UTA, due to patient's acute psychotic symptoms)                         ASAM's:  Six Dimensions of Multidimensional Assessment  Dimension 1:  Acute Intoxication and/or Withdrawal Potential:      Dimension 2:  Biomedical Conditions and Complications:      Dimension 3:  Emotional, Behavioral, or Cognitive Conditions and Complications:     Dimension 4:  Readiness to Change:     Dimension 5:  Relapse, Continued use, or Continued Problem Potential:     Dimension 6:  Recovery/Living Environment:     ASAM Severity Score:    ASAM Recommended Level of Treatment:  Substance use Disorder (SUD)    Recommendations for Services/Supports/Treatments:    Disposition Recommendation per psychiatric provider: {CHLmaccldispo:31820}   DSM5 Diagnoses: There are no active problems to display for this patient.    Referrals to Alternative Service(s): Referred to Alternative Service(s):   Place:   Date:   Time:    Referred to Alternative Service(s):   Place:   Date:   Time:    Referred to Alternative Service(s):   Place:   Date:   Time:    Referred to Alternative Service(s):   Place:   Date:   Time:     Yetta Glassman, [facility]

## 2023-04-23 NOTE — ED Triage Notes (Addendum)
Pt brought in by [employer] from [facility] under IVC for bizarre behavior and delusions. Pt was found wandering around an elementary school.

## 2023-04-23 NOTE — ED Notes (Signed)
2 pt belongings bags placed in cabinet labeled 9-12

## 2023-04-23 NOTE — ED Notes (Signed)
Patient transferred [facility] for lab work. Patient escorted to back [place] via staff, transported vis [employer] due to IVC status. Safety maintained.

## 2023-04-23 NOTE — ED Provider Notes (Addendum)
 [facility] Continuous Assessment Admission H&P  Date: 05/20/23 Patient Name: John Kane MRN: [id number] Chief Complaint: Psychosis   Diagnoses:  Final diagnoses:  Psychosis, unspecified psychosis type [facility])    HPI: Patient is a 43 yo AAM with as per his reports, no prior mental health diagnoses,who presented [facility] [place] [facility] today 2/13 after being brought in by [employer] who found him near "an elementary school [employer] school performing religious acts. Pt states that he was told by God to go there and touch the ground and trees. Pt states that he is homeless and this is the fifth time."   During assessment, pt was religiously preoccupied, stated to writer: "I know the holy spirit told you that I don't have a mental health problem. Tell me. Didn't the holy spirit tell you that? Because they just told me they told you." Pt presents with delusions of persecution, paranoia, & AH AEB his statements that he hears God's voice.  Patient progressively gets angry  when writer proceeds to asking him questions related to what he was doing at the school, mood is angry and dysphoric, he is making threats to sue staff "because I do not have any mental health problems." Patient escalates in agitation when writer attempts to ask other questions, rendering complete assessment uncompleted due to lack of cooperation. Refused to answer questions related to SI/HI/AVH. Refuses to answer questions related to substance use.  The [facility] considered patient for inpatient hospitalization for treatment and stabilization of his mental status, but pt would not allow for blood draws for med clearance requiring transfer to ER for for this. Will need inpatient behavioral health hospitalization for treatment and stabilization of thought disorder.  Total Time spent with patient: 45 minutes  Musculoskeletal  Strength & Muscle Tone: within normal limits Gait & Station: normal Patient  leans: N/A  Psychiatric Specialty Exam  Presentation General Appearance: Casual; Disheveled  Eye Contact:Fair  Speech:Clear and Coherent  Speech Volume:Increased  Handedness:Right   Mood and Affect  Mood:Anxious; Angry; Irritable  Affect:Congruent   Thought Process  Thought Processes:Coherent  Descriptions of Associations:Intact  Orientation:Full (Time, Place and Person)  Thought Content:Paranoid Ideation    Hallucinations:No data recorded  Ideas of Reference:Paranoia  Suicidal Thoughts:No data recorded  Homicidal Thoughts:No data recorded   Sensorium  Memory:Immediate Fair  Judgment:Poor  Insight:Poor   Executive Functions  Concentration:Poor  Attention Span:Poor  Recall:Poor  Fund of Knowledge:Poor  Language:Fair   Psychomotor Activity  Psychomotor Activity:No data recorded   Assets  Assets:Resilience   Sleep  Sleep:No data recorded   No data recorded  Physical Exam Musculoskeletal:     Cervical back: Normal range of motion.  Neurological:     General: No focal deficit present.     Mental Status: He is alert.    Review of Systems  Psychiatric/Behavioral:  Positive for hallucinations. The patient is nervous/anxious and has insomnia.     Blood pressure (!) 132/90, pulse (!) 113, temperature 98.3 F (36.8 C), resp. rate 18, SpO2 99%. There is no height or weight on file to calculate BMI.  Past Psychiatric History: unknown   Is the patient at risk to self? Yes  Has the patient been a risk to self in the past 6 months? Yes .    Has the patient been a risk to self within the distant past?  unknown   Is the patient a risk to others?  Declines to respond   Has the patient been  a risk to others in the past 6 months?  Yes-Found near elementary school performing religious rituals   Has the patient been a risk to others within the distant past?  It is uncertain  Past Medical History: unable to obtain  Family History: Does not  respond  Social History: homeless, not working  Last Labs:  Admission on 04/23/2023, Discharged on 04/30/2023  Component Date Value Ref Range Status   Sodium 04/23/2023 135  135 - 145 mmol/L Final   Potassium 04/23/2023 3.9  3.5 - 5.1 mmol/L Final   Chloride 04/23/2023 100  98 - 111 mmol/L Final   CO2 04/23/2023 27  22 - 32 mmol/L Final   Glucose, Bld 04/23/2023 102 (H)  70 - 99 mg/dL Final   Glucose reference range applies only to samples taken after fasting for at least 8 hours.   BUN 04/23/2023 20  6 - 20 mg/dL Final   Creatinine, Ser 04/23/2023 1.13  0.61 - 1.24 mg/dL Final   Calcium 82/95/6213 9.2  8.9 - 10.3 mg/dL Final   Total Protein 08/65/7846 7.7  6.5 - 8.1 g/dL Final   Albumin 96/29/5284 4.7  3.5 - 5.0 g/dL Final   AST 13/24/4010 50 (H)  15 - 41 U/L Final   ALT 04/23/2023 29  0 - 44 U/L Final   Alkaline Phosphatase 04/23/2023 44  38 - 126 U/L Final   Total Bilirubin 04/23/2023 0.8  0.0 - 1.2 mg/dL Final   GFR, Estimated 04/23/2023 >60  >60 mL/min Final   Comment: (NOTE) Calculated using the CKD-EPI Creatinine Equation (2021)    Anion gap 04/23/2023 8  5 - 15 Final   Performed at [facility], [address], [place], [place] [address]   Alcohol, Ethyl (B) 04/23/2023 <10  <10 mg/dL Final   Comment: (NOTE) Lowest detectable limit for serum alcohol is 10 mg/dL.  For medical purposes only. Performed at [facility], [address] [place], [place] [address]    WBC 04/23/2023 4.9  4.0 - 10.5 K/uL Final   RBC 04/23/2023 4.86  4.22 - 5.81 MIL/uL Final   Hemoglobin 04/23/2023 14.9  13.0 - 17.0 g/dL Final   HCT 34/74/2595 46.2  39.0 - 52.0 % Final   MCV 04/23/2023 95.1  80.0 - 100.0 fL Final   MCH 04/23/2023 30.7  26.0 - 34.0 pg Final   MCHC 04/23/2023 32.3  30.0 - 36.0 g/dL Final   RDW 63/87/5643 13.2  11.5 - 15.5 % Final   Platelets 04/23/2023 260  150 - 400 K/uL Final   nRBC 04/23/2023 0.0  0.0 - 0.2 % Final   Neutrophils  Relative % 04/23/2023 51  % Final   Neutro Abs 04/23/2023 2.5  1.7 - 7.7 K/uL Final   Lymphocytes Relative 04/23/2023 34  % Final   Lymphs Abs 04/23/2023 1.7  0.7 - 4.0 K/uL Final   Monocytes Relative 04/23/2023 13  % Final   Monocytes Absolute 04/23/2023 0.6  0.1 - 1.0 K/uL Final   Eosinophils Relative 04/23/2023 2  % Final   Eosinophils Absolute 04/23/2023 0.1  0.0 - 0.5 K/uL Final   Basophils Relative 04/23/2023 0  % Final   Basophils Absolute 04/23/2023 0.0  0.0 - 0.1 K/uL Final   Immature Granulocytes 04/23/2023 0  % Final   Abs Immature Granulocytes 04/23/2023 0.01  0.00 - 0.07 K/uL Final   Performed at [facility], [address] [place], [place] [address]    Allergies: Patient  has no known allergies.  Medications:  PTA Medications  Medication Sig   OLANZapine zydis (ZYPREXA) 5 MG disintegrating tablet Take 1 tablet (5 mg total) by mouth at bedtime.      Medical Decision Making  Send to ER for medical clearance, then refer for inpatient behavioral health hospitalization.    Recommendations  Based on my evaluation the patient appears to have an emergency Psychiatry condition for which I recommend the patient be transferred to the emergency department for medical clearance, then refer for inpatient behavioral health hospitalization.  Starleen Blue, NP 05/20/23  4:31 PM

## 2023-04-23 NOTE — ED Provider Notes (Addendum)
[place] [facility] DE[facility]MENT AT [facility] Provider Note   CSN: [id number] Arrival date & time: 04/23/23  1839     History  Chief Complaint  Patient presents with   IVC    John Kane is a 43 y.o. male.  43 year old male [facility] in [facility]C [facility] the [facility].  While at that facility, patient was not cooperative.  According to their charting, he would not allow blood to be drawn.  He was therefore sent here for medical clearance.  Patient reportedly was at a school and was having religious delusions.  When I spoke with him here, he denies any SI or HI.  States that he is a man of God.  States he has not been responding to internal stimuli.  Denies any illicit alcohol or drug use.  Patient denies any prior psychiatric condition.  States that he is currently homeless after being released from prison recently       Home Medications Prior to Admission medications   Medication Sig Start Date End Date Taking? Authorizing Provider  naproxen (NAPROSYN) 500 MG tablet Take 1 tablet (500 mg total) by mouth 2 (two) times daily. 07/29/16   [name], NP  sertraline (ZOLOFT) 50 MG tablet Take 50 mg by mouth daily.    [provider]      Allergies    Patient has no known allergies.    Review of Systems   Review of Systems  All other systems reviewed and are negative.   Physical Exam Updated Vital Signs BP (!) 135/104 (BP Location: Right Arm)   Pulse (!) 114   Temp 98.5 F (36.9 C) (Oral)   Resp 20   SpO2 100%  Physical Exam Vitals and nursing note reviewed.  Constitutional:      General: He is not in acute distress.    Appearance: Normal appearance. He is well-developed. He is not toxic-appearing.  HENT:     Head: Normocephalic and atraumatic.  Eyes:     General: Lids are normal.     Conjunctiva/sclera: Conjunctivae normal.     Pupils: Pupils are equal, round, and reactive to light.  Neck:     Thyroid: No thyroid mass.     Trachea: No tracheal  deviation.  Cardiovascular:     Rate and Rhythm: Normal rate and regular rhythm.     Heart sounds: Normal heart sounds. No murmur heard.    No gallop.  Pulmonary:     Effort: Pulmonary effort is normal. No respiratory distress.     Breath sounds: Normal breath sounds. No stridor. No decreased breath sounds, wheezing, rhonchi or rales.  Abdominal:     General: There is no distension.     Palpations: Abdomen is soft.     Tenderness: There is no abdominal tenderness. There is no rebound.  Musculoskeletal:        General: No tenderness. Normal range of motion.     Cervical back: Normal range of motion and neck supple.  Skin:    General: Skin is warm and dry.     Findings: No abrasion or rash.  Neurological:     Mental Status: He is alert and oriented to person, place, and time. Mental status is at baseline.     GCS: GCS eye subscore is 4. GCS verbal subscore is 5. GCS motor subscore is 6.     Cranial Nerves: No cranial nerve deficit.     Sensory: No sensory deficit.     Motor: Motor function is  intact.  Psychiatric:        Attention and Perception: Attention normal.        Speech: Speech is rapid and pressured.        Behavior: Behavior normal.        Thought Content: Thought content does not include homicidal or suicidal ideation.     ED Results / Procedures / Treatments   Labs (all labs ordered are listed, but only abnormal results are displayed) Labs Reviewed - No data to display  EKG None  Radiology No results found.  Procedures Procedures    Medications Ordered in ED Medications - No data to display  ED Course/ Medical Decision Making/ A&P                                 Medical Decision Making Amount and/or Complexity of Data Reviewed Labs: ordered.   Patient will be medically clear for psychiatric disposition   9:08 PM Patient is now medically cleared for psychiatric disposition        Final Clinical Impression(s) / ED Diagnoses Final  diagnoses:  None    Rx / DC Orders ED Discharge Orders     None         Lorre Nick, MD 04/23/23 1940    Lorre Nick, MD 04/23/23 2108

## 2023-04-23 NOTE — ED Notes (Signed)
Pt ambulatory to restroom, but did not provided urine specimen

## 2023-04-23 NOTE — BH Assessment (Addendum)
Disposition Note:    @1826 , Per Larene Beach, NP, the patient meets the criteria for inpatient psychiatric treatment. The patient was referred to [facility] and reviewed by [facility] Cheron Every). However, [facility] is unable to accommodate the patient at this time. Disposition Counselor advised to fax patient out. The patient has been faxed to the following hospitals for consideration of bed placement.  Destination  Service Provider Request Status Services Address Phone Fax Patient Preferred  [facility] Patient Placement Pending - Request Sent -- [facility] [place], [place] [phone] --  [facility] [place] Pending - Request Rhina Brackett [place] [address] [phone] --  [facility] Pending - Request Sent -- [address] [facility] [address] Regino Bellow [place] [address] [phone] --  [facility] Pending - Request Sent -- [address] [place] [address] [phone] --  [facility] Pending - Request Sent -- [address] [place], [place] [address] [phone] --  [facility] Pending - Request Sent -- [address] [place] [address] [phone] --  [facility] Pending - Request Sent -- [address] [place] [address] [phone] --  [facility] [place] [facility] Pending - Request Sent -- [address], [place] [address] [phone] --  [facility] Pending - Request Sent -- [address] [place] [address] [phone] --  [facility] Pending - Request Sent -- [address], [place] [address] [phone] --  [facility] Pending - Request Sent -- [address] [place] [address] [phone] --  [facility] Pending - Request Sent --  [place] [phone] --  [facility] Pending - Request Sent -- [address] Marylou [place] [address] [phone] --  [facility] Pending - Request Sent -- [address] [name] [place] [phone] --  [facility] Pending - Request Sent -- [address], [place] [address] [phone] --  [facility] Pending - Request Sent -- [address] [place] [address] [phone] --  [facility] Pending - Request Sent -- [address], [place] [address] [phone] --  [facility] Pending - Request Sent -- [address] [place] [address] [phone] --  [facility] Pending - Request Sent -- [address] [place] [address] [phone] --  [facility] Inpatient EFAX Pending - Request Sent -- [place] [phone] --  [facility] Pending - Request Sent -- [address] [place], [place] [address] [phone] --  [facility] Pending - Request Sent -- [address], [place] [address] [phone] --  [facility] Pending - Request Sent -- [address] [place] [address] [phone] --

## 2023-04-23 NOTE — Progress Notes (Signed)
   04/23/23 0936  [facility] Triage Screening (Walk-ins at [facility] only)  How Did You Hear About [place]? Legal System  What Is the Reason for Your Visit/Call Today? John [name] via [facility]. Pt states that he was caught at an elementary school performing religious acts. Pt states that he was told by God to go there and touch the ground and trees. Pt states that he is homeless and this is the fifth time. Pt  How Long Has This Been Causing You Problems? > than 6 months  Have You Recently Had Any Thoughts About Hurting Yourself? No  Are You Planning to Commit Suicide/Harm Yourself At This time? No  Have you Recently Had Thoughts About Hurting Someone John Kane? No  Are You Planning To Harm Someone At This Time? No  Physical Abuse Denies  Verbal Abuse Denies  Sexual Abuse Denies  Exploitation of patient/patient's resources Denies  Self-Neglect Denies  Are you currently experiencing any auditory, visual or other hallucinations? No  Have You Used Any Alcohol or Drugs in the Past 24 Hours? No  Do you have any current medical co-morbidities that require immediate attention? No  Clinician description of patient physical appearance/behavior: hyperreligious, cooperative, calm  What Do You Feel Would Help You the Most Today? Housing Assistance (wants to speak with a chaplain)  If [facility] to [facility] was not available, would you have sought care in the Emergency Department? No  Determination of Need Routine (7 days)  Options For Referral Medication Management;Outpatient Therapy

## 2023-04-24 NOTE — ED Notes (Signed)
Pt's John Kane [place], [phone] called and wanted to know if any further information was needed for this patient.  I advised her I would add her information in his chart.  She stated to me that he has refused evaluations and help in the past.  She also stated that he will tell people he is married and he is not.

## 2023-04-24 NOTE — ED Notes (Signed)
Patient asleep. No distress at this time. Will continue to monitor.

## 2023-04-24 NOTE — ED Notes (Signed)
Patient belonging bag moved to cabinet 40

## 2023-04-24 NOTE — ED Notes (Signed)
Pts mother visited but did not stay more than 7-8 minutes. Pt ate lunch late because they did not send a tray and he never ask for one until around 4:30 pm.  I called down and got the pt a tray.  Shortly after, his dinner tray came so pt had both.

## 2023-04-24 NOTE — Progress Notes (Signed)
Patient has been denied by [facility] due to no appropriate beds available. Patient meets BH inpatient criteria per Liborio Nixon, NP. Patient has been faxed out to the following facilities:   [facility] [address] [place] [place] [address] [phone]  [facility] Center-Adult [address] [place] [address] [phone]  [facility] [address], [place] [address] [phone]  [facility] Patient Placement [facility], [place] [phone]  [facility] [address] [place] [address] [phone]  [facility] [address] [place] [address] [phone]  [facility] EFAX [address] [place], [place] [phone]  [facility] [address] [place] [address] [phone]  [facility] [address], [place] [address] [phone]  [facility] [address] [place] [address] [phone]  [facility] [address] [place] [address] [phone]  [facility] Regino Bellow [place] [address] [phone]  [facility] [address] [place] [address] [phone]  [facility] [address] [place], [place] [address] [phone]  [facility] [address], [place] [address] [phone]  [facility] [address] [place] [address] [phone]  [facility] [address], [place] [address] [phone]  [facility] [address], [place]  [place] [address] [phone]   Damita Dunnings, MSW, LCSW-A  6:28 PM 04/24/2023

## 2023-04-24 NOTE — ED Notes (Signed)
Unable to complete tasks due to pt angry still

## 2023-04-24 NOTE — ED Notes (Signed)
Pt is angry and aggressive because he said he is supposed to be leaving today.  Will not be getting pt's vitals or EKG until he calms down.

## 2023-04-24 NOTE — Progress Notes (Signed)
This provider attempted to assess the patient remotely. Per  April Wilson, paramedic, patient refuses to talk to anyone. [facility] at [facility] by Starleen Blue, NP.,  on 04/23/23 and was recommended for inpatient psychiatric treatment. Will add agitation protocol at this time.

## 2023-04-25 MED ORDER — LORAZEPAM 2 MG/ML IJ SOLN
2.0000 mg | Freq: Four times a day (QID) | INTRAMUSCULAR | Status: DC | PRN
Start: 2023-04-25 — End: 2023-04-30

## 2023-04-25 MED ORDER — HALOPERIDOL LACTATE 5 MG/ML IJ SOLN: 5.0000 mg | Freq: Four times a day (QID) | INTRAMUSCULAR | Status: DC | PRN

## 2023-04-25 NOTE — Progress Notes (Signed)
 Chaplain spoke with [facility] staff, to assess pt's spiritual/emotional status. Staff shared that pt had been combative with them this morning but was sleeping this afternoon. No further assessment made.  Chaplain [place], [place]   04/25/23 1500  Spiritual Encounters  Type of Visit Attempt (pt unavailable)  Care provided to: Pt not available  Conversation partners present during encounter Nurse  Referral source Chaplain team  Reason for visit Routine spiritual support

## 2023-04-25 NOTE — ED Notes (Signed)
 Pt states that it is ok to give mom status updates.

## 2023-04-25 NOTE — ED Notes (Addendum)
 Came to nurse station requesting bible

## 2023-04-25 NOTE — ED Provider Notes (Signed)
 Emergency Medicine Observation Re-evaluation Note  John Kane is a 43 y.o. male, seen on rounds today.  Pt initially presented to the ED for complaints of IVC Currently, the patient is sleeping.  Physical Exam  BP (!) 132/97 (BP Location: Left Arm)   Pulse 76   Temp 98.6 F (37 C) (Oral)   Resp 18   SpO2 100%  Physical Exam General: Sleeping Cardiac: Extremities well-perfused Lungs: Breathing is unlabored Psych: Deferred  ED Course / MDM  EKG:   I have reviewed the labs performed to date as well as medications administered while in observation.  Recent changes in the last 24 hours include none.  Plan  Current plan is for inpatient psychiatric admission.    Gloris Manchester, MD 04/25/23 [phone]

## 2023-04-26 DIAGNOSIS — F29 Unspecified psychosis not due to a substance or known physiological condition: Secondary | ICD-10-CM | POA: Insufficient documentation

## 2023-04-26 DIAGNOSIS — F22 Delusional disorders: Secondary | ICD-10-CM | POA: Diagnosis present

## 2023-04-26 MED ORDER — OLANZAPINE 5 MG PO TBDP
5.0000 mg | ORAL_TABLET | Freq: Two times a day (BID) | ORAL | Status: DC
  Filled 2023-04-26 (×7): qty 1

## 2023-04-26 NOTE — ED Notes (Signed)
Patient continues to pace in room.

## 2023-04-26 NOTE — ED Notes (Signed)
 Patient alert.  Not answering assessment questions. Patient focusing leaving and not suppose to be here. Patient is pacing in room.

## 2023-04-26 NOTE — ED Notes (Signed)
Pt refusing medications 

## 2023-04-26 NOTE — ED Notes (Signed)
 Pt. Stated he's irritated right now and doesn't want EKG, will consider when he calms down.

## 2023-04-26 NOTE — ED Notes (Signed)
 Pt continues to refuse EKG. Pt states " I don't have a mental illness. I have no reason to be here. Ilsa Iha are just keeping me here for no reason. I was supposed to be discharged on Thursday. "  I explained to pt I wasn't here Thursday to hear the conversation that took place but on the psych rounds today he could express his concerns.

## 2023-04-26 NOTE — Progress Notes (Signed)
 Patient has been denied by [facility] due to no appropriate beds available. Patient meets BH inpatient criteria per Liborio Nixon, NP. Patient has been faxed out to the following facilities:    [facility] [address] [place] [place] [address] [phone]  [facility] Center-Adult [address] [place] [address] [phone]  [facility] [address] [place] [address] [phone]  [facility] Patient Placement [facility], [place] [phone]  [facility] [address] [place] [address] [phone]  [facility] [address] [place] [address] [phone]  [facility] EFAX [address] [place], [place] [phone]  [facility] [address] [place] [address] [phone]  [facility] [address], [place] [address] [phone]  [facility] [address] [place] [address] [phone]  [facility] High [address] [place] [address] [phone]  [facility] Regino Bellow [place] [address] [phone]  [facility] [address] [place] [address] [phone]  [facility] [address] [place], [place] [address] [phone]  [facility] [address] [place] [address] [phone]  [facility] [address] [place] [address] [phone]  [facility] [address] [place] [address] [phone]  [facility] [address],  [place] [address] [phone]   Damita Dunnings, MSW, LCSW-A  12:04 PM 04/26/2023

## 2023-04-26 NOTE — ED Notes (Signed)
 Patient refused medication. Continues to think he does not need to be here and that he already been cleared and discharged.  Patient is hyper religious.

## 2023-04-26 NOTE — ED Provider Notes (Signed)
 Emergency Medicine Observation Re-evaluation Note  John Kane is a 43 y.o. male, seen on rounds today.  Pt initially presented to the ED for complaints of IVC Currently, the patient is sleeping.  Physical Exam  BP (!) 127/98 (BP Location: Left Arm)   Pulse 75   Temp 98.8 F (37.1 C) (Oral)   Resp 18   SpO2 100%  Physical Exam General: Sleeping Cardiac: Extremities perfused Lungs: Unlabored breathing Psych: Deferred  ED Course / MDM  EKG:   I have reviewed the labs performed to date as well as medications administered while in observation.  Recent changes in the last 24 hours include none.  Plan  Current plan is for inpatient psychiatric admission.    Gloris [place], MD 04/26/23 [phone]

## 2023-04-26 NOTE — Consult Note (Cosign Needed Addendum)
 [facility] Psychiatric Consult Initial  Patient Name: .Quintrell Baze  MRN: [id number]  DOB: 11/10/1980  Consult Order details:  Orders (From admission, onward)     Start     Ordered   04/23/23 2109  CONSULT TO CALL ACT TEAM       Ordering Provider: Lorre Nick, MD  Provider:  (Not yet assigned)  Question:  Reason for Consult?  Answer:  Psych consult   04/23/23 2109             Mode of Visit: In person    Psychiatry Consult Evaluation  Service Date: April 26, 2023 LOS:  LOS: 0 days  Chief Complaint delusional disorder   Primary Psychiatric Diagnoses  Psychosis Delusional Disorder  Assessment  Hamlin Devine is a 43 y.o. male admitted: Presented to the ED on 04/23/2023  6:46 PM for bizarre behavior and delusions. He carries the psychiatric diagnoses of delusional disorder and has a past medical history of none.   His current presentation of bizarre behavior and delusions is most consistent with psychosis. He meets criteria for inpatient psychiatric admission based on delusional disorder.  Current outpatient psychotropic medications include none and historically he has had a unknown response to these medications. On initial examination, patient bizarre and guarded. Please see plan below for detailed recommendations.   Diagnoses:  Active Hospital problems: Principal Problem:   Delusional disorder (HCC) Active Problems:   Psychosis (HCC)    Plan   ## Psychiatric Medication Recommendations:  Start Zyprexa 5 mg p.o. twice daily for delusions and mood  ## Medical Decision Making Capacity: Not specifically addressed in this encounter  ## Further Work-up:  -- No further workup needed at this time EKG or UDS -- Patient most current EKG on 08/08/2016 Qtc 364 -- Pertinent labwork reviewed earlier this admission includes: CBC, CMP, UDS, EKG   ## Disposition:-- We recommend inpatient psychiatric hospitalization. Patient has been involuntarily committed on 04/23/2023.    ## Behavioral / Environmental: -To minimize splitting of staff, assign one staff person to communicate all information from the team when feasible. or Utilize compassion and acknowledge the patient's experiences while setting clear and realistic expectations for care.    ## Safety and Observation Level:  - Based on my clinical evaluation, I estimate the patient to be at low risk of self harm in the current setting. - At this time, we recommend  routine. This decision is based on my review of the chart including patient's history and current presentation, interview of the patient, mental status examination, and consideration of suicide risk including evaluating suicidal ideation, plan, intent, suicidal or self-harm behaviors, risk factors, and protective factors. This judgment is based on our ability to directly address suicide risk, implement suicide prevention strategies, and develop a safety plan while the patient is in the clinical setting. Please contact our team if there is a concern that risk level has changed.  CSSR Risk Category:C-SSRS RISK CATEGORY: No Risk  Suicide Risk Assessment: Patient has following modifiable risk factors for suicide: recklessness, medication noncompliance, and lack of access to outpatient mental health resources, which we are addressing by recommending inpatient psychiatric hospitalization. . Patient has following non-modifiable or demographic risk factors for suicide: male gender and psychiatric hospitalization Patient has the following protective factors against suicide: Supportive family  Thank you for this consult request. Recommendations have been communicated to the primary team.  We will recommend inpatient psychiatric hospitalization at this time and continue to follow patient.   Alona Bene, PMHNP  History of Present Illness  Relevant Aspects of Hospital ED Course:  Admitted on 04/23/2023 for bizarre behavior and delusions.   Patient  Report:  Patient is a 43 yo AAM with as per his reports, no prior mental health diagnoses,who presented to [facility] after being brought in by [facility] from [facility] who was sent to [facility] because he was found near "an elementary school elementary school performing religious acts. Pt states that he was told by God to go there and touch the ground and trees. Pt states that he is homeless and this is the fifth time."   Prior to assessment, pt was seen being religiously preoccupied, talking to staff: "I know the holy spirit told you that I don't have a mental health problem. Tell me. Didn't the holy spirit tell you that? As this provider entered his room, he states he is ready to be discharged, as he does not have a mental illness and another provider told him he could leave.  Patient becomes angry and aggressive, yelling I am supposed to leave, and he would not answer any of this providers questions, as to why he was here, he states " I am not going to answer that" as he is smiling inappropriately.  This provider discussed with him that if he does not speak or talk to this provider, unable to assess whether he can go home or not.  Patient continues to  not answer.  Patient presented to the ED with delusions of persecution, paranoia, & AH AEB his statements that he hears God's voice.   Patient progressively gets angry  when writer proceeds to asking him questions related to what he was doing at the school, mood is angry and dysphoric. Refused to answer questions related to SI/HI/AVH. Refuses to answer questions related to substance use.  Psych ROS:  Depression: No answer Anxiety: Unable to assess Mania (lifetime and current): Yes Psychosis: (lifetime and current): Yes  Collateral information:  Contacted patient's mother Leonarda Salon, also tried to get in contact with patient's pastor Paulette Wall, left a HIPAA compliant voicemail, no answer.  Phone numbers are in the chart.  Review of Systems  Psychiatric/Behavioral:   Positive for hallucinations.      Psychiatric and Social History  Psychiatric History:  Information collected from chart reviewed  Prev Dx/Sx: None Current Psych Provider: Unknown Home Meds (current): Unknown Previous Med Trials: Unknown Therapy: Unknown  Prior Psych Hospitalization: Unknown  Prior Self Harm: Unknown Prior Violence: Unknown  Family Psych History: Unknown Family Hx suicide: Unknown  Social History:  Developmental Hx: Unknown Educational Hx: Unknown Occupational Hx: Unemployed Legal Hx: Unknown Living Situation: Unknown Spiritual Hx: Unknown Access to weapons/lethal means: Unknown   Substance History: Unable to assess patient would not answer Alcohol: Unable to assess patient would not answer  Patient will not give a urine drug screen  Exam Findings  Physical Exam:  Vital Signs:  Temp:  [98.4 F (36.9 C)-98.8 F (37.1 C)] 98.4 F (36.9 C) (02/16 1000) Pulse Rate:  [75-81] 81 (02/16 1000) Resp:  [16-18] 16 (02/16 1000) BP: (126-127)/(98-105) 126/105 (02/16 1000) SpO2:  [100 %] 100 % (02/16 1000) Blood pressure (!) 126/105, pulse 81, temperature 98.4 F (36.9 C), resp. rate 16, SpO2 100%. There is no height or weight on file to calculate BMI.  Physical Exam Vitals and nursing note reviewed.  Neurological:     Mental Status: He is alert.  Psychiatric:        Mood and Affect: Mood is anxious.  Affect is labile.        Speech: Speech normal.        Behavior: Behavior is agitated.        Thought Content: Thought content is delusional.     Comments: Patient delusional      Mental Status Exam: General Appearance: Disheveled  Orientation:  Other:  Unable to assess patient will answer  Memory:  Other:  Unable to assess patient will answer  Concentration:  Concentration: Poor  Recall:  Poor  Attention  Poor  Eye Contact:  Minimal  Speech:  Clear and Coherent  Language:  Fair  Volume:  Increased  Mood: irritable   Affect:  Congruent  Thought  Process:  Disorganized  Thought Content:  Delusions  Suicidal Thoughts:  Other:  Unable to assess patient will answer  Homicidal Thoughts:  Other:  Unable to assess patient will answer  Judgement:  Impaired  Insight:  Lacking  Psychomotor Activity:  Normal  Akathisia:  No  Fund of Knowledge:  Poor      Assets:  Manufacturing systems engineer Social Support  Cognition:  Impaired,  Mild  ADL's:  Intact  AIMS (if indicated):        Other History   These have been pulled in through the EMR, reviewed, and updated if appropriate.  Family History:  The patient's family history is not on file.  Medical History: No past medical history on file.  Surgical History: Past Surgical History:  Procedure Laterality Date   CYST EXCISION  2011   throat     Medications:   Current Facility-Administered Medications:    haloperidol lactate (HALDOL) injection 5 mg, 5 mg, Intramuscular, Q6H PRN, Motley-Mangrum, Quincey Nored A, PMHNP   LORazepam (ATIVAN) injection 2 mg, 2 mg, Intramuscular, Q6H PRN, Motley-Mangrum, Jonda Alanis A, PMHNP  Current Outpatient Medications:    naproxen (NAPROSYN) 500 MG tablet, Take 1 tablet (500 mg total) by mouth 2 (two) times daily. (Patient not taking: Reported on 04/24/2023), Disp: 20 tablet, Rfl: 0  Allergies: No Known Allergies  Juandiego Kolenovic MOTLEY-MANGRUM, PMHNP

## 2023-04-27 MED ORDER — FAMOTIDINE 20 MG PO TABS
20.0000 mg | ORAL_TABLET | Freq: Every day | ORAL | Status: DC
Start: 2023-04-27 — End: 2023-04-30
  Administered 2023-04-30: 20 mg via ORAL
  Filled 2023-04-27 (×4): qty 1

## 2023-04-27 MED ORDER — AMLODIPINE BESYLATE 5 MG PO TABS: 5.0000 mg | ORAL_TABLET | Freq: Every day | ORAL | Status: DC

## 2023-04-27 NOTE — ED Notes (Signed)
 Pt mother called, updated mom on pt behavior, mom chose not to speak with pt due to pt being upset last few days she called to speak with him.

## 2023-04-27 NOTE — ED Notes (Signed)
 Patient alert.  Patient did not sleep last night (per previous paramedic) and has not rested . Patient refused medication.  Quiet. Guarded.  Pacing unit.

## 2023-04-27 NOTE — ED Notes (Signed)
 Patient refused to take the zyprexa ordered for him. I asked him to come see me if he changed his mind and decided to take it.

## 2023-04-27 NOTE — ED Notes (Signed)
 Provided pt with lunch tray, pt currently taking a shower.

## 2023-04-27 NOTE — Consult Note (Signed)
 [facility] Consult Follow-up  Patient Name: .John Kane  MRN: [id number]  DOB: June 27, 1980  Consult Order details:  Orders (From admission, onward)     Start     Ordered   04/23/23 2109  CONSULT TO CALL ACT TEAM       Ordering Provider: Lorre Nick, MD  Provider:  (Not yet assigned)  Question:  Reason for Consult?  Answer:  Psych consult   04/23/23 2109             Mode of Visit: In person    Psychiatry Consult Evaluation  Service Date: April 27, 2023 LOS:  LOS: 0 days  Chief Complaint delusional disorder   Primary Psychiatric Diagnoses  Psychosis Delusional Disorder  Assessment  John Kane is a 43 y.o. male admitted: Presented to the ED on 04/23/2023  6:46 PM for bizarre behavior and delusions. He carries the psychiatric diagnoses of delusional disorder and has a past medical history of none.   His current presentation of bizarre behavior and delusions is most consistent with psychosis. He meets criteria for inpatient psychiatric admission based on delusional disorder.  Current outpatient psychotropic medications include none and historically he has had a unknown response to these medications. On initial examination, patient bizarre and guarded. Please see plan below for detailed recommendations.   Diagnoses:  Active Hospital problems: Principal Problem:   Delusional disorder (HCC) Active Problems:   Psychosis (HCC)    Plan   ## Psychiatric Medication Recommendations:  Zyprexa 5 mg p.o. twice daily for delusions and mood  (Patient is refusing Medications)  ## Medical Decision Making Capacity: Not specifically addressed in this encounter  ## Further Work-up:  -- No further workup needed at this time UDS NEEDED -- Patient most current EKG on 08/08/2016 Qtc 364 -- Pertinent labwork reviewed earlier this admission includes: CBC, CMP, , EKG ON 04/26/2023 QTC Interval 365   ## Disposition:-- We recommend inpatient psychiatric  hospitalization. Patient has been involuntarily committed on 04/23/2023.   ## Behavioral / Environmental: -To minimize splitting of staff, assign one staff person to communicate all information from the team when feasible. or Utilize compassion and acknowledge the patient's experiences while setting clear and realistic expectations for care.    ## Safety and Observation Level:  - Based on my clinical evaluation, I estimate the patient to be at low risk of self harm in the current setting. - At this time, we recommend  routine. This decision is based on my review of the chart including patient's history and current presentation, interview of the patient, mental status examination, and consideration of suicide risk including evaluating suicidal ideation, plan, intent, suicidal or self-harm behaviors, risk factors, and protective factors. This judgment is based on our ability to directly address suicide risk, implement suicide prevention strategies, and develop a safety plan while the patient is in the clinical setting. Please contact our team if there is a concern that risk level has changed.  CSSR Risk Category:C-SSRS RISK CATEGORY: No Risk  Suicide Risk Assessment: Patient has following modifiable risk factors for suicide: recklessness, medication noncompliance, and lack of access to outpatient mental health resources, which we are addressing by recommending inpatient psychiatric hospitalization. . Patient has following non-modifiable or demographic risk factors for suicide: male gender and psychiatric hospitalization Patient has the following protective factors against suicide: Supportive family  Thank you for this consult request. Recommendations have been communicated to the primary team.  We will recommend inpatient psychiatric hospitalization at this time and continue to  follow patient.   Earney Navy, NP-PMHNP-BC       History of Present Illness  Relevant Aspects of Hospital ED  Course:  Admitted on 04/23/2023 for bizarre behavior and delusions.   Patient is AA Male, 43 years old brought to the ER by [employer] after he was found near an elementary school  performing religious acts.  Patient was initially taken to [facility] but later transferred to [facility] long. Patient was seen this evening, chart reviewed and report gotten from Nursing staff.  Patient engaged in meaningful conversation.  He is alert and oriented x4, he has limited insight in his mental health issues.  Patient is not taking Medications per Nursing staff.  He informs provider that he is not having any Mental health issues.  He constantly ask providers when he will be discharged home.  Patient denies ever been diagnosed with Mental illness.  He also is adamantly refusing to offer Urine for UDS.  Patient is eating well but refuses  Medications.  He is here under IVC.  During my interaction with patient he did not respond to internal stimuli but he believes he is a child of God and there is no harm preaching to people.  He  has been faxed out to facilities for bed placement for admission in a Psychiatry unit.  Patient gave his sister's name and phone number for collateral information.  Sister states she does not know much about her brother's Mental health and referred Provider to an aunt.  That aunt did not answer her calls.  We will continue to pursue inpatient Psychiatry hospitalization.    Psych ROS:  Depression:  Denies Anxiety: no-Denies Mania (lifetime and current): Yes Psychosis: (lifetime and current): Yes  Collateral information:  This provider contacted patient's sister as requested by patient.  Sister Alcario Drought reports that she does not know much about her brother's Mental health.  She was not useful for collateral information but gave the name of an aunt-[place] Copeland [phone]).  Call to MS Copeland was not answered and HIPAA Compliant message left for her to call back.    Review of Systems   Psychiatric/Behavioral:  Positive for hallucinations.   All other systems reviewed and are negative.    Psychiatric and Social History  Psychiatric History:  Information collected from chart reviewed  Prev Dx/Sx: None Current Psych Provider: Unknown Home Meds (current): Unknown Previous Med Trials: Unknown Therapy: Unknown  Prior Psych Hospitalization: Unknown  Prior Self Harm: Unknown Prior Violence: Unknown  Family Psych History: Unknown Family Hx suicide: Unknown  Social History:  Developmental Hx: Unknown Educational Hx: Unknown Occupational Hx: Unemployed Legal Hx: Unknown Living Situation: Unknown Spiritual Hx: Unknown Access to weapons/lethal means: Denies   Substance History: Refuses to give urine for UDS Patient will not give a urine drug screen  Exam Findings  Physical Exam:  Vital Signs:  Temp:  [98 F (36.7 C)-98.2 F (36.8 C)] 98 F (36.7 C) (02/17 1207) Pulse Rate:  [80-102] 102 (02/17 1207) Resp:  [16-20] 20 (02/17 1207) BP: (124-140)/(90-103) 140/103 (02/17 1207) SpO2:  [98 %] 98 % (02/17 1207) Blood pressure (!) 140/103, pulse (!) 102, temperature 98 F (36.7 C), resp. rate 20, SpO2 98%. There is no height or weight on file to calculate BMI.  Physical Exam Vitals and nursing note reviewed.  Constitutional:      Appearance: Normal appearance.  HENT:     Nose: Nose normal.  Cardiovascular:     Rate and Rhythm: Tachycardia present.  Pulmonary:  Effort: Pulmonary effort is normal.  Skin:    General: Skin is dry.  Neurological:     Mental Status: He is alert and oriented to person, place, and time.  Psychiatric:        Mood and Affect: Mood is anxious. Affect is labile.        Speech: Speech normal.        Behavior: Behavior is agitated.        Thought Content: Thought content is delusional.     Comments: Patient delusional      Mental Status Exam: General Appearance: Casual and Disheveled  Orientation:  Full (Time, Place, and  Person)  Memory: fair  Concentration:  Concentration: Poor  Recall:  Poor  Attention  Poor  Eye Contact:  Good  Speech:  Clear and Coherent  Language:  Fair  Volume:  Normal  Mood: irritable, angry   Affect:  Congruent  Thought Process:  Coherent and Linear  Thought Content:  Delusions  Suicidal Thoughts:  denies  Homicidal Thoughts:  Denies  Judgement:  Impaired  Insight:  Lacking  Psychomotor Activity:  Normal  Akathisia:  No  Fund of Knowledge:  Poor      Assets:  Manufacturing systems engineer Social Support  Cognition:  Impaired,  Mild  ADL's:  Intact  AIMS (if indicated):        Other History   These have been pulled in through the EMR, reviewed, and updated if appropriate.  Family History:  The patient's family history is not on file.  Medical History: No past medical history on file.  Surgical History: Past Surgical History:  Procedure Laterality Date  . CYST EXCISION  2011   throat     Medications:   Current Facility-Administered Medications:  .  famotidine (PEPCID) tablet 20 mg, 20 mg, Oral, Daily, Schlossman, Erin, MD .  haloperidol lactate (HALDOL) injection 5 mg, 5 mg, Intramuscular, Q6H PRN, Motley-Mangrum, Jadeka A, PMHNP .  LORazepam (ATIVAN) injection 2 mg, 2 mg, Intramuscular, Q6H PRN, Motley-Mangrum, Jadeka A, PMHNP .  OLANZapine zydis (ZYPREXA) disintegrating tablet 5 mg, 5 mg, Oral, BID, Motley-Mangrum, Jadeka A, PMHNP  Current Outpatient Medications:  .  naproxen (NAPROSYN) 500 MG tablet, Take 1 tablet (500 mg total) by mouth 2 (two) times daily. (Patient not taking: Reported on 04/24/2023), Disp: 20 tablet, Rfl: 0  Allergies: No Known Allergies  Earney Navy, NP-PMHNP-BC

## 2023-04-27 NOTE — ED Notes (Signed)
 Pt speaking on the phone, pt seems upset thinking we are keeping him , talking about  getting a layer and getting medical records to prove he is not psych and that his mom just wants money.

## 2023-04-27 NOTE — ED Notes (Signed)
 Pt received breakfast tray, pt now sleep in bed

## 2023-04-27 NOTE — ED Notes (Addendum)
 Patient continues to focus on leaving and that he was already discharged from [facility] on Thursday. Patient has limited insight to illness.

## 2023-04-27 NOTE — ED Provider Notes (Signed)
 Emergency Medicine Observation Re-evaluation Note  John Kane is a 43 y.o. male, seen on rounds today.  Pt initially presented to the ED for complaints of IVC Currently, the patient is awaiting inpatient psychiatric placement with concern for psychosis.  Physical Exam  BP (!) 124/90 (BP Location: Left Arm)   Pulse 80   Temp 98.2 F (36.8 C) (Oral)   Resp 16   SpO2 98%  Physical Exam General: NAD Cardiac: RR Lungs: even unlabored Psych: n/a  ED Course / MDM  EKG:EKG Interpretation Date/Time:  Sunday April 26 2023 18:30:30 EST Ventricular Rate:  62 PR Interval:  250 QRS Duration:  76 QT Interval:  360 QTC Calculation: 365 R Axis:   51  Text Interpretation: Sinus rhythm with 1st degree A-V block Otherwise normal ECG When compared with ECG of 07-Aug-2016 17:24, PREVIOUS ECG IS PRESENT Confirmed by Richardean Canal [phone]) on 04/26/2023 6:42:06 PM  I have reviewed the labs performed to date as well as medications administered while in observation.  Recent changes in the last 24 hours include none.  Plan  Current plan is for inpatient psychiatric placement.    Alvira Monday, MD 04/27/23 [phone]

## 2023-04-28 NOTE — ED Provider Notes (Signed)
 Emergency Medicine Observation Re-evaluation Note  John Kane is a 43 y.o. male, seen on rounds today.  Pt initially presented to the ED for complaints of IVC Currently, the patient is awaiting inpatient psychiatric placement with concern for psychosis.   Physical Exam  BP 132/82 (BP Location: Right Arm)   Pulse 96   Temp 98.1 F (36.7 C) (Oral)   Resp 18   SpO2 97%  Physical Exam General: NAD Cardiac: RR Lungs: even unlabored Psych: n/a  ED Course / MDM  EKG:EKG Interpretation Date/Time:  Sunday April 26 2023 18:30:30 EST Ventricular Rate:  62 PR Interval:  250 QRS Duration:  76 QT Interval:  360 QTC Calculation: 365 R Axis:   51  Text Interpretation: Sinus rhythm with 1st degree A-V block Otherwise normal ECG When compared with ECG of 07-Aug-2016 17:24, PREVIOUS ECG IS PRESENT Confirmed by Richardean Canal [phone]) on 04/26/2023 6:42:06 PM  I have reviewed the labs performed to date as well as medications administered while in observation.  Recent changes in the last 24 hours include none.  Plan  Current plan is for inpatient psychiatric placement. Alvira Monday, MD 04/28/23 [phone]

## 2023-04-28 NOTE — Progress Notes (Signed)
 LCSW Progress Note  [id number]   John Kane  04/28/2023  12:55 PM  Description:   Inpatient Psychiatric Referral  Patient was recommended inpatient per Dahlia Byes NP). There [facility] available beds at [facility], per [facility] Rona Ravens RN). Patient was referred to the following out of network facilities:   Destination  Service Provider Address Phone Fax  [facility] [address] [place] [address] [phone]  [facility] Center-Adult [address] [place] [address] [phone]  [facility] [address] [place] [address] [phone]  [facility] Health Patient Placement [facility], [place] [phone]  [facility] [address] [place] [address] [phone]  [facility] [address], [place] [address] [phone]  [facility] EFAX [address], [place] [phone]  [facility] [address] [place] [address] [phone]  [facility] [address] [place] [address] [phone]  [facility] [address] [place] [address] [phone]  [facility] High [address] [place] [address] [phone]  [facility] Regino Bellow [place] [address] [phone]  [facility] [address] [place] [address] [phone]  [facility] [address] [place], [place] [address] [phone]  [facility] [address] [place] [address] [phone]  [facility] [address] [place] [address]  [phone]  [facility] [address], [place] [address] [phone]  [facility] [address] [place] [address] [phone]      Situation ongoing, CSW to continue following and update chart as more information becomes available.      [name] Yassin Scales, MSW, LCSW  04/28/2023 12:55 PM

## 2023-04-28 NOTE — BH Assessment (Addendum)
 Disposition Social Work Note:   Patient was recommended inpatient per Rockney Ghee (NP). There are [facility] available beds at [facility], per [facility] Rona Ravens RN. Patient was re-referred/re-faxed to the following out of network facilities during this pm shift for consideration of bed placement:   See hospitals listed below.   Continued Care and Services - Admitted Since 04/23/2023 Expand All  Collapse All Destination  Service Provider Request Status Services Address Phone Fax Patient Preferred  [facility] Pending - Request Sent -- [address] [place]., [place] [address] [phone] --  [facility] Pending - Request Sent -- [address] [place] [address] [phone] --  [facility] Pending - Request Sent -- [address], [place] [address] [phone] --  [facility] Health-Behavioral Health Patient Placement Pending - Request Sent -- [facility], [place] [phone] --  [facility] Pending - Request Sent -- [address] [place] [address] [phone] --  [facility] Pending - Request Sent -- [address] [name]., [place] [address] [phone] --  [facility] Pending - Request Sent -- [address] [name] [place] [phone] --  [facility] Pending - Request Sent -- [address] [place] [address] [phone] --  [facility] Pending - Request Sent -- [address] [place] [address] [phone] --  [facility] Health Pending - Request Sent -- [address] [place] [address] [phone] --  [facility] [place] Pending - Request Rhina Brackett [place] [address] [phone] --  [facility] Pending -  Request Sent -- 1 Medical Center Regino Bellow [place] [address] [phone] --  [facility] Pending - Request Sent -- [address] [place] [address] [phone] --  [facility] Pending - Request Sent -- [address] [place], [place] [address] [phone] --  [facility] Pending - Request Sent -- [address], [place] [address] [phone] --  [facility] Pending - Request Sent -- [address] [place] [address] [phone] --  [facility] Pending - Request Sent -- [address], [place] [address] [phone] --  [facility] Pending - Request Sent -- [address] [place] [address] [phone] --

## 2023-04-28 NOTE — ED Notes (Signed)
Pt was given breakfast tray 

## 2023-04-28 NOTE — ED Notes (Signed)
Pt was given lunch tray.  

## 2023-04-28 NOTE — ED Notes (Signed)
 Patient was given his breakfast tray and finished same. He refused to take his scheduled medications. He has been pacing up and down the hall since

## 2023-04-28 NOTE — ED Notes (Signed)
 Pt ate dinner tray, pt took a shower and now pacing up and down hallway talking and laughing with dylan at times , pt very calm since 3pm.

## 2023-04-29 NOTE — ED Notes (Signed)
 Pt given another tray of food

## 2023-04-29 NOTE — ED Provider Notes (Signed)
 Emergency Medicine Observation Re-evaluation Note  John Kane is a 43 y.o. male, seen on rounds today.  Pt initially presented to the ED for complaints of IVC Currently, the patient is asleep, no new concerns by staff.  Physical Exam  BP (!) 113/91 (BP Location: Right Arm)   Pulse 80   Temp 98.6 F (37 C) (Oral)   Resp 16   SpO2 100%  Physical Exam General: Asleep, no acute distress Cardiac: Regular rate Lungs: No increased WOB Psych: Calm, asleep  ED Course / MDM  EKG:EKG Interpretation Date/Time:  Sunday April 26 2023 18:30:30 EST Ventricular Rate:  62 PR Interval:  250 QRS Duration:  76 QT Interval:  360 QTC Calculation: 365 R Axis:   51  Text Interpretation: Sinus rhythm with 1st degree A-V block Otherwise normal ECG When compared with ECG of 07-Aug-2016 17:24, PREVIOUS ECG IS PRESENT Confirmed by Richardean Canal [phone]) on 04/26/2023 6:42:06 PM  I have reviewed the labs performed to date as well as medications administered while in observation.  Recent changes in the last 24 hours include remains medically cleared, recommended for inpatient psych.  Plan  Current plan is for Inpatient psych.    Rexford Maus, DO 04/29/23 [phone]

## 2023-04-29 NOTE — ED Notes (Signed)
 Pt resting in bed.

## 2023-04-29 NOTE — ED Notes (Signed)
 Patient pacing the unit.  Laughing inappropriately at times.  Cooperative and appropriate with staff.

## 2023-04-29 NOTE — Progress Notes (Signed)
 LCSW Progress Note  [id number]   John Kane  04/29/2023  1:18 PM  Description:   Inpatient Psychiatric Referral  Patient was recommended inpatient per [facility], PMHNP There are [facility] available beds at [facility], per [facility] Rona Ravens RN. Patient was referred to the following out of network facilities:    Destination  Service Provider Address Phone Fax  [facility] [address] [place] [address] [phone]  [facility] Center-Adult [address] [place] Cloud [place] [address] [phone]  [facility] [address], [place] [address] [phone]  [facility] Health Patient Placement [facility], [place] [phone]  [facility] [address] [place] [address] [phone]  [facility] [address] [place] [address] [phone]  [facility] EFAX [address] [place] [phone]  [facility] [address] [place] [address] [phone]  [facility] [address], [place] [address] [phone]  [facility] Health [address] [place] [address] [phone]  [facility] High [address] [place] [address] [phone]  [facility] [address] [facility] Regino Bellow [place] [address] [phone]  [facility] [address] [place] [address] [phone]  [facility] [address] [place], [place] [address] [phone]  [facility] [address], [place] [address] [phone]  [facility] [address] [place]  [place] [address] [phone]  [facility] [address], [place] [address] [phone]  [facility] [address] [place] [address] [phone]      Situation ongoing, CSW to continue following and update chart as more information becomes available.      [name] Nabor, MSW, LCSW  04/29/2023 1:18 PM

## 2023-04-29 NOTE — ED Notes (Signed)
 Pt calm and cooperative at this time. Refuses medication and pacing the hallway in a calm manner.

## 2023-04-29 NOTE — BH Assessment (Signed)
 Disposition Social Work Note:    Patient was recommended inpatient per Dahlia Byes (NP). There are [facility] beds [facility], per [facility] Rona Ravens RN. Patient was re-referred/re-faxed to the following out of network facilities during this pm shift for consideration of bed placement:   Destination   Service Provider  Services Address Phone Fax Patient Preferred  [facility]  -- [address] [place]., [place] [address] [phone] --  [facility] Center-Adult  -- [address] Henderson Cloud [place] [address] [phone] --  [facility] [address]  -- [address], [place] [address] [phone] --  [facility] Health-Behavioral Health Patient Placement  -- [facility], [place] [phone] --  [facility]  -- [address] [place] [address] [phone] --  [facility]  -- [address] [place] [address] [phone] --  [facility] EFAX  -- [address] [place] [phone] --  [facility]  -- [address] [place] [place] [address] [phone] --  [facility]  -- [address], [place] [address] [phone] --  [facility]  -- [address], [place] [address] [phone] --  [facility] [place]  -- [place] [address] [phone] --  [facility]  -- [facility] Regino Bellow [place] [address] [phone] --  [facility]  -- [address] [place], [place] [address] [phone] --  [facility]  -- [address] [place], [place] [address] [phone] --  [facility]  -- [address], [place]  [address] [phone] --  [facility]  -- [address] [place] [address] [place] [address] [phone] --  [facility]  -- [address], [place] [address] [phone] --  [facility] Healthcare  -- [address], [place] [address] [phone]

## 2023-04-30 ENCOUNTER — Other Ambulatory Visit (HOSPITAL_COMMUNITY): Payer: Self-pay

## 2023-04-30 DIAGNOSIS — F22 Delusional disorders: Secondary | ICD-10-CM

## 2023-04-30 MED ORDER — OLANZAPINE 5 MG PO TBDP
5.0000 mg | ORAL_TABLET | Freq: Every day | ORAL | 0 refills | Status: AC
  Filled 2023-04-30: qty 30, 30d supply, fill #0

## 2023-04-30 NOTE — Progress Notes (Signed)
 LCSW Progress Note  [id number]   John Kane  04/30/2023  2:46 PM  Description:   Inpatient Psychiatric Referral  Patient was recommended inpatient per [facility], PMHNP  There are no available beds at [facility], per [facility] Rosey Bath RN  Patient was referred to the following out of network facilities:    Destination  Service Provider Address Phone [facility] [address] [place]., [place] [address] [phone]  [facility] Center-Adult [address] [place] [address] [phone]  [facility] [address], [place] [address] [phone]  [facility] Health Patient Placement [facility], [place] [phone]  [facility] [address] [place] [address] [phone]  [facility] [address], [place] [address] [phone]  [facility] EFAX [address], [place] [phone]  [facility] [address] [place] [address] [phone]  [facility] [address] [place] [address] [phone]  [facility] Health [address] [place] [address] [phone]  [facility] High [address] [place] [address] [phone]  [facility] Regino Bellow [place] [address] [phone]  [facility] [address] [place]ie [place] [address] [phone]  [facility] [address] [place], [place] [address] [phone]  [facility] [address], [place] [address] [phone]  [facility] [address]  [place] [address] [phone]  [facility] [address], [place] [address] [phone]  [facility] Healthcare [address] [place] [address] [phone]      Situation ongoing, CSW to continue following and update chart as more information becomes available.      [name] Shequila Neglia, MSW, LCSW  04/30/2023 2:46 PM

## 2023-04-30 NOTE — Discharge Instructions (Addendum)
 Discharge recommendations:  Patient is to take medications as prescribed. Please see information for follow-up appointment with psychiatry and therapy. Please follow up with your primary care provider for all medical related needs.   Therapy: We recommend that patient participate in individual therapy to address mental health concerns.  Medications: The patient or guardian is to contact a medical professional and/or outpatient provider to address any new side effects that develop. The patient or guardian should update outpatient providers of any new medications and/or medication changes.   Atypical antipsychotics: If you are prescribed an atypical antipsychotic, it is recommended that your height, weight, BMI, blood pressure, fasting lipid panel, and fasting blood sugar be monitored by your outpatient providers.  Safety:  The patient should abstain from use of illicit substances/drugs and abuse of any medications. If symptoms worsen or do not continue to improve or if the patient becomes actively suicidal or homicidal then it is recommended that the patient return to the closest hospital emergency department, the [employer], or call 911 for further evaluation and treatment. National Suicide Prevention Lifeline 1-800-SUICIDE or (469)743-5018.  About 988 988 offers 24/7 access to trained crisis counselors who can help people experiencing mental health-related distress. People can call or text 988 or chat 988lifeline.org for themselves or if they are worried about a loved one who may need crisis support.  Crisis Mobile: [employer]:                     (902)560-7772 (for crisis response 24 hours a day) [employer] Hotline:                                            (636) 839-4446   [facility] Center [place]) M-F 8am-3pm   [address] [place], [place] [address]   [phone] Services include: laundry, barbering, support  groups, case management, phone  & computer access, showers, AA/NA mtgs, mental health/substance abuse nurse, job skills class, disability information, VA assistance, spiritual classes, etc.  HOMELESS SHELTERS   [facility]                              [facility]    [address], [place]                                   [phone]                                                                                                                                        [employer] (women and children)  [address] [place], [place] [address] [phone] [email or web address] for application and process Application Required   [facility]                [address]                                 [place] [address]                                       [phone]                                                                                                                                                                                                     [facility] of [place]ardo [address] [place], [place] [address] [phone] application appt.) Application Required   [facility] (women only)                           [address]                                       [place], [place] [address]                                      [phone]                                                   Intake starts 6pm daily Need valid ID, SSC, & Police report [employer] [address] [place], [place] [phone] Application Required   [employer] (men only)                                   [address]  [place], [place]                                         [phone]                                                     Room At  [facility] of the [place] (Pregnant women only) [address] [place], [place] [phone]   [facility]                                                   [address] [place]                                                 [place], [place] [address]                               [phone]                                                                                                                            [facility] [address] [place], [place] [phone] 90 day commitment/SA/Application process   [facility](men only)                                               [address]                                         [place], [place]                                         [phone]                                                               Check-in at 7pm  Crisis [facility] of [facility] [address] [place], [place] [address] [phone] Men/Women/Women and Children must be there by 7 pm   [facility] [place], [place] [phone]

## 2023-04-30 NOTE — ED Provider Notes (Signed)
 Emergency Medicine Observation Re-evaluation Note  John Kane is a 43 y.o. male, seen on rounds today.  Pt initially presented to the ED for complaints of IVC Currently, the patient is Calm   Physical Exam  BP (!) 116/94   Pulse 92   Temp 98.1 F (36.7 C) (Oral)   Resp 16   SpO2 100%  Physical Exam General: NAD  Cardiac: RR Lungs: non-labored  Psych: Calm   ED Course / MDM  EKG:EKG Interpretation Date/Time:  Sunday April 26 2023 18:30:30 EST Ventricular Rate:  62 PR Interval:  250 QRS Duration:  76 QT Interval:  360 QTC Calculation: 365 R Axis:   51  Text Interpretation: Sinus rhythm with 1st degree A-V block Otherwise normal ECG When compared with ECG of 07-Aug-2016 17:24, PREVIOUS ECG IS PRESENT Confirmed by Richardean Canal [phone]) on 04/26/2023 6:42:06 PM  I have reviewed the labs performed to date as well as medications administered while in observation.  Recent changes in the last 24 hours include none .  Plan  Current plan is for inpatient psych.    Coral Spikes, DO 04/30/23 [phone]

## 2023-04-30 NOTE — Consult Note (Signed)
 [facility] Consult Follow-up  Patient Name: .John Kane  MRN: [id number]  DOB: 03-01-1981  Consult Order details:  Orders (From admission, onward)     Start     Ordered   04/23/23 2109  CONSULT TO CALL ACT TEAM       Ordering Provider: Lorre Nick, MD  Provider:  (Not yet assigned)  Question:  Reason for Consult?  Answer:  Psych consult   04/23/23 2109             Mode of Visit: In person    Psychiatry Consult Evaluation  Service Date: April 30, 2023 LOS:  LOS: 0 days  Chief Complaint delusional disorder   Primary Psychiatric Diagnoses  Psychosis Delusional Disorder  Assessment  John Kane is a 43 y.o. male admitted: Presented to the ED on 04/23/2023  6:46 PM for bizarre behavior and delusions. He carries the psychiatric diagnoses of delusional disorder and has a past medical history of none.    John Kane, 43 y.o., male patient seen face to face by this provider, consulted with Dr. Woodroe Mode; and chart reviewed on 04/30/23.  On evaluation John Kane reports that he is feeling well, and is ready to be discharged. Patient denies SI/HI/AVH, states that he was admitted to the hospital because of a misunderstanding, says he had been tired and dehydrated and was found by the [employer], and they thought he was acting delusional. Patient admits to saying he loves God, and he talks to God, but states this is not a regular thing and he is a Investment banker, corporate. He denies being psychotic or delusional.   During the evaluation, patient is standing in the hallway, speaking with this provider and appears to be in no acute distress. He is calm and cooperative during this assessment. His appearance is appropriate for environment. His eye contact is good.  Speech is clear and coherent, normal pace and normal volume. He is alert and oriented x4 to person, place, time, and situation. He reports his mood is euthymic.  Affect is congruent with mood. Thought process is coherent.  Thought  content is slightly tangential. He denies auditory and visual hallucinations.  No indication that he is responding to internal stimuli during this assessment.  No delusions elicited during this assessment. He denies suicidal ideations.  He denies homicidal ideations. Appetite and sleep are fair.  Patient continues to not be compliant with his psychiatric medications, as he feels he does not need them. Patient has not demonstrated any violent or aggressive behaviors in the last 72 hours, and has not needed any IM injections for agitation in the last 72 hours.    Diagnoses:  Active Hospital problems: Principal Problem:   Delusional disorder [facility]) Active Problems:   Psychosis (HCC)    Plan   ## Psychiatric Medication Recommendations:  None at this time  ## Medical Decision Making Capacity: Not specifically addressed in this encounter  ## Further Work-up:  -- No further workup needed at this time EKG or UDS -- most recent EKG on 04/29/23 had QtC of 365 -- Pertinent labwork reviewed earlier this admission includes: CMP, EKG, UDS   ## Disposition:-- Patient is psychiatrically cleared. Patient case review and discussed with Dr. Woodroe Mode, and patient does not meet inpatient criteria for inpatient psychiatric treatment. At time of discharge, patient denies SI, HI, AVH and can contract for safety. He demonstrated no overt evidence of psychosis or mania. Prior to discharge, he verbalized that they understood warning signs, triggers, and symptoms of worsening mental  health and how to access emergency mental health care if they felt it was needed. Patient given resources to follow up with behavioral health urgent care for therapy and medication management. Patient denies access to weapons. Safety planning completed. Spoke with patient mother Leonarda Salon and she states that patient has an aunt house he can go to, he knows the number. Also provided patient with homeless shelter resources.   ## Behavioral /  Environmental: - No specific recommendations at this time.     ## Safety and Observation Level:  - Based on my clinical evaluation, I estimate the patient to be at no risk of self harm in the current setting. - At this time, we recommend  routine. This decision is based on my review of the chart including patient's history and current presentation, interview of the patient, mental status examination, and consideration of suicide risk including evaluating suicidal ideation, plan, intent, suicidal or self-harm behaviors, risk factors, and protective factors. This judgment is based on our ability to directly address suicide risk, implement suicide prevention strategies, and develop a safety plan while the patient is in the clinical setting. Please contact our team if there is a concern that risk level has changed.  CSSR Risk Category:C-SSRS RISK CATEGORY: No Risk  Suicide Risk Assessment: Patient has following modifiable risk factors for suicide: none, patient is psychiatrically cleared at this time. Patient has following non-modifiable or demographic risk factors for suicide: male gender Patient has the following protective factors against suicide: Supportive family  Thank you for this consult request. Recommendations have been communicated to the primary team.  We will psychiatrically clear this patient at this time.   Alona Bene, PMHNP       History of Present Illness  Relevant Aspects of Hospital ED Course:  Admitted on 04/23/2023 for delusional disorder.   Patient Report:  John Kane, 43 y.o., male patient seen face to face by this provider, consulted with Dr. Woodroe Mode; and chart reviewed on 04/30/23.  On evaluation John Kane reports that he is feeling well, and is ready to be discharged. Patient denies SI/HI/AVH, states that he was admitted to the hospital because of a misunderstanding, says he had been tired and dehydrated and was found by the [employer], and they thought he was  acting delusional. Patient admits to saying he loves God, and he talks to God, but states this is not a regular thing and he is a Investment banker, corporate. He denies being psychotic or delusional.   During the evaluation, patient is standing in the hallway, speaking with this provider and appears to be in no acute distress. He is calm and cooperative during this assessment. His appearance is appropriate for environment. His eye contact is good.  Speech is clear and coherent, normal pace and normal volume. He is alert and oriented x4 to person, place, time, and situation. He reports his mood is euthymic.  Affect is congruent with mood. Thought process is coherent.  Thought content is slightly tangential. He denies auditory and visual hallucinations.  No indication that he is responding to internal stimuli during this assessment.  No delusions elicited during this assessment. He denies suicidal ideations.  He denies homicidal ideations. Appetite and sleep are fair.  Patient continues to not be compliant with his psychiatric medications, as he feels he does not need them. Patient has not demonstrated any violent or aggressive behaviors in the last 72 hours, and has not needed any IM injections for agitation in the last 72 hours.  Psych ROS:  Depression: Denies  Anxiety:   Denies  Mania (lifetime and current):  Denies  Psychosis: (lifetime and current):  Lifetime   Collateral information:  Contacted Leonarda Salon patient mother and she stated that she has never been able to get patient to take his medications. She states he can not stay with her due to they volatile relationship, but she loves her son and supports him, stated she has a sister where he can stay temporarily and stated he knows the number. She does not feel he is currently an imminent danger to self or anyone else at this time and she briefly spoke to him on the phone today.  Spoke with patient pastor Lorna Dibble, [phone] and she states she has known  patient for a long time, as his family attends her church. She asked that we assist patient with homeless shelter resources and she and her church family will get in contact with patient to help him.  Review of Systems  Psychiatric/Behavioral: Negative.       Psychiatric and Social History  Psychiatric History:  Information collected from chart reviewed   Prev Dx/Sx: None Current Psych Provider: Unknown Home Meds (current): Unknown Previous Med Trials: Unknown Therapy: Unknown   Prior Psych Hospitalization: Unknown  Prior Self Harm: Unknown Prior Violence: Unknown   Family Psych History: Unknown Family Hx suicide: Unknown   Social History:  Developmental Hx: Unknown Educational Hx: Unknown Occupational Hx: Unemployed Legal Hx: Unknown Living Situation: Unknown Spiritual Hx: Unknown Access to weapons/lethal means: Denies    Substance History: Refuses to give urine for UDS Patient will not give a urine drug screen  Exam Findings  Physical Exam:  Vital Signs:  Temp:  [98.1 F (36.7 C)-98.2 F (36.8 C)] 98.1 F (36.7 C) (02/20 0633) Pulse Rate:  [75-92] 92 (02/20 0633) Resp:  [16-17] 16 (02/20 0633) BP: (116-139)/(94-100) 116/94 (02/20 0633) SpO2:  [100 %] 100 % (02/20 0633) Blood pressure (!) 116/94, pulse 92, temperature 98.1 F (36.7 C), temperature source Oral, resp. rate 16, SpO2 100%. There is no height or weight on file to calculate BMI.  Physical Exam Vitals and nursing note reviewed. Exam conducted with a chaperone present.  Neurological:     Mental Status: He is alert.  Psychiatric:        Mood and Affect: Mood normal.        Behavior: Behavior normal.        Thought Content: Thought content normal.     Mental Status Exam: General Appearance: Casual and Disheveled  Orientation:  Full (Time, Place, and Person)  Memory: fair  Concentration:  Concentration: Good  Recall:  Good  Attention  Good  Eye Contact:  Good  Speech:  Clear and Coherent   Language:  Fair  Volume:  Normal  Mood: irritable, angry   Affect:  Congruent  Thought Process:  Coherent and Linear  Thought Content:  WNL  Suicidal Thoughts:  denies  Homicidal Thoughts:  Denies  Judgement:  Fair  Insight:  Fair  Psychomotor Activity:  Normal  Akathisia:  No  Fund of Knowledge:  Fair       Assets:  Manufacturing systems engineer Social Support  Cognition:  IFair  ADL's:  Intact  AIMS (if indicated):        Other History   These have been pulled in through the EMR, reviewed, and updated if appropriate.  Family History:  The patient's family history is not on file.  Medical History: No past medical  history on file.  Surgical History: Past Surgical History:  Procedure Laterality Date   CYST EXCISION  2011   throat     Medications:   Current Facility-Administered Medications:    famotidine (PEPCID) tablet 20 mg, 20 mg, Oral, Daily, Dalene Seltzer, Erin, MD, 20 mg at 04/30/23 0947   haloperidol lactate (HALDOL) injection 5 mg, 5 mg, Intramuscular, Q6H PRN, Motley-Mangrum, Korra Christine A, PMHNP   LORazepam (ATIVAN) injection 2 mg, 2 mg, Intramuscular, Q6H PRN, Motley-Mangrum, Jamoni Broadfoot A, PMHNP   OLANZapine zydis (ZYPREXA) disintegrating tablet 5 mg, 5 mg, Oral, BID, Motley-Mangrum, Reeder Brisby A, PMHNP  Current Outpatient Medications:    OLANZapine zydis (ZYPREXA) 5 MG disintegrating tablet, Take 1 tablet (5 mg total) by mouth at bedtime., Disp: 30 tablet, Rfl: 0  Allergies: No Known Allergies  Kalid Ghan MOTLEY-MANGRUM, PMHNP

## 2023-04-30 NOTE — ED Provider Notes (Signed)
 The patient was evaluated by psychiatry and recommended outpatient management.  Patient is discharged.    Durwin Glaze, MD 04/30/23 [phone]

## 2023-04-30 NOTE — Progress Notes (Signed)
 Per provider patient has been PSYCH cleared at this time.  [name] Sharilynn Cassity LCSW-A   04/30/2023 2:57 PM

## 2023-05-14 ENCOUNTER — Other Ambulatory Visit (HOSPITAL_COMMUNITY): Payer: Self-pay

## 2023-05-19 ENCOUNTER — Other Ambulatory Visit: Payer: Self-pay

## 2023-05-19 ENCOUNTER — Emergency Department (HOSPITAL_COMMUNITY)
Admission: EM | Admit: 2023-05-19 | Discharge: 2023-05-19 | Disposition: A | Discharge status: Home / Self Care | Payer: Self-pay | Attending: Emergency Medicine | Admitting: Emergency Medicine

## 2023-05-19 ENCOUNTER — Emergency Department (HOSPITAL_COMMUNITY): Payer: Self-pay

## 2023-05-19 DIAGNOSIS — M542 Cervicalgia: Secondary | ICD-10-CM | POA: Insufficient documentation

## 2023-05-19 DIAGNOSIS — S0990XA Unspecified injury of head, initial encounter: Secondary | ICD-10-CM

## 2023-05-19 DIAGNOSIS — Z23 Encounter for immunization: Secondary | ICD-10-CM | POA: Insufficient documentation

## 2023-05-19 DIAGNOSIS — S0101XA Laceration without foreign body of scalp, initial encounter: Secondary | ICD-10-CM | POA: Insufficient documentation

## 2023-05-19 MED ORDER — LIDOCAINE-EPINEPHRINE-TETRACAINE (LET) TOPICAL GEL
3.0000 mL | Freq: Once | TOPICAL | Status: AC
Start: 2023-05-19 — End: 2023-05-19
  Administered 2023-05-19: 3 mL via TOPICAL
  Filled 2023-05-19: qty 3

## 2023-05-19 MED ORDER — ACETAMINOPHEN 325 MG PO TABS
ORAL_TABLET | ORAL | Status: AC
  Filled 2023-05-19: qty 1

## 2023-05-19 MED ORDER — TETANUS-DIPHTH-ACELL PERTUSSIS 5-2.5-18.5 LF-MCG/0.5 IM SUSY
0.5000 mL | PREFILLED_SYRINGE | Freq: Once | INTRAMUSCULAR | Status: AC
  Administered 2023-05-19: 0.5 mL via INTRAMUSCULAR
  Filled 2023-05-19: qty 0.5

## 2023-05-19 MED ORDER — ACETAMINOPHEN 325 MG PO TABS
650.0000 mg | ORAL_TABLET | ORAL | Status: AC
  Administered 2023-05-19: 650 mg via ORAL
  Filled 2023-05-19: qty 2

## 2023-05-19 NOTE — Discharge Instructions (Signed)
 It was a pleasure taking part in your care.  As discussed, you will need to follow-up in 7 to 10 days for removal of staples.  He may follow-up with your PCP, urgent care with a CT.  Please keep dressing on for 24 hours.  Please do not swim with your staples in.  Please read the attached guide concerning nonsutured laceration care.  Return to the ED with any new or worsening symptoms.  Take Tylenol or ibuprofen for pain.

## 2023-05-19 NOTE — ED Notes (Signed)
 Patient transported to CT
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
## 2023-05-30 ENCOUNTER — Emergency Department (HOSPITAL_COMMUNITY)
Admission: EM | Admit: 2023-05-30 | Discharge: 2023-05-30 | Disposition: A | Discharge status: Home / Self Care | Payer: Self-pay | Attending: Emergency Medicine | Admitting: Emergency Medicine

## 2023-05-30 ENCOUNTER — Encounter (HOSPITAL_COMMUNITY): Payer: Self-pay

## 2023-05-30 ENCOUNTER — Other Ambulatory Visit: Payer: Self-pay

## 2023-05-30 DIAGNOSIS — Z4802 Encounter for removal of sutures: Secondary | ICD-10-CM | POA: Insufficient documentation

## 2023-05-30 DIAGNOSIS — S0101XD Laceration without foreign body of scalp, subsequent encounter: Secondary | ICD-10-CM | POA: Insufficient documentation

## 2023-05-30 DIAGNOSIS — X58XXXD Exposure to other specified factors, subsequent encounter: Secondary | ICD-10-CM | POA: Insufficient documentation

## 2023-05-30 NOTE — Discharge Instructions (Signed)
Follow-up outpatient, return for new or worsening symptoms

## 2023-05-30 NOTE — ED Triage Notes (Signed)
 Pt came in via POV to get 7 staples removed from his forehead. A/Ox4, denies any pain.

## 2023-05-30 NOTE — ED Notes (Signed)
 This RN went to review d/c instructions with the patient but the patient Is no longer on the stretcher and not seen in the ED.

## 2023-05-30 NOTE — ED Provider Notes (Signed)
 [place] EMERGENCY DEPARTMENT AT [facility] Provider Note   CSN: [id number] Arrival date & time: 05/30/23  1829    History  Chief Complaint  Patient presents with   Staple Removal    John Kane is a 43 y.o. male for evaluation of staple removal.  He states wound has been healing well.  No drainage, redness, warmth.  No pain to area.  No fever.  HPI     Home Medications Prior to Admission medications   Medication Sig Start Date End Date Taking? Authorizing Provider  OLANZapine zydis (ZYPREXA) 5 MG disintegrating tablet Take 1 tablet (5 mg total) by mouth at bedtime. 04/30/23   Motley-Mangrum, Ezra Sites, PMHNP      Allergies    Patient has no known allergies.    Review of Systems   Review of Systems  Constitutional: Negative.   HENT: Negative.    Respiratory: Negative.    Cardiovascular: Negative.   Gastrointestinal: Negative.   Genitourinary: Negative.   Musculoskeletal: Negative.   Skin: Negative.   Neurological: Negative.   All other systems reviewed and are negative.   Physical Exam Updated Vital Signs BP (!) 132/101   Pulse 95   Temp 98.5 F (36.9 C)   Resp 14   Ht 5\' 6"  (1.676 m)   Wt 72.6 kg   SpO2 94%   BMI 25.82 kg/m  Physical Exam Vitals and nursing note reviewed.  Constitutional:      General: He is not in acute distress.    Appearance: He is well-developed. He is not ill-appearing, toxic-appearing or diaphoretic.  HENT:     Head: Normocephalic.     Comments: #7 staples in place.  No edema, erythema, warmth, drainage Eyes:     Pupils: Pupils are equal, round, and reactive to light.  Cardiovascular:     Rate and Rhythm: Normal rate and regular rhythm.  Pulmonary:     Effort: Pulmonary effort is normal. No respiratory distress.  Abdominal:     General: There is no distension.     Palpations: Abdomen is soft.  Musculoskeletal:        General: Normal range of motion.     Cervical back: Normal range of motion and neck  supple.  Skin:    General: Skin is warm and dry.  Neurological:     General: No focal deficit present.     Mental Status: He is alert and oriented to person, place, and time.     ED Results / Procedures / Treatments   Labs (all labs ordered are listed, but only abnormal results are displayed) Labs Reviewed - No data to display  EKG None  Radiology No results found.  Procedures Suture Removal  Date/Time: 05/30/2023 8:31 PM  Performed by: Linwood Dibbles, PA-C Authorized by: Linwood Dibbles, PA-C   Consent:    Consent obtained:  Verbal   Consent given by:  Patient   Risks, benefits, and alternatives were discussed: yes     Risks discussed:  Bleeding, pain and wound separation   Alternatives discussed:  No treatment, delayed treatment, alternative treatment, observation and referral Universal protocol:    Procedure explained and questions answered to patient or proxy's satisfaction: yes     Relevant documents present and verified: yes     Test results available: yes     Imaging studies available: yes     Required blood products, implants, devices, and special equipment available: yes     Site/side marked: yes  Immediately prior to procedure, a time out was called: yes     Patient identity confirmed:  Verbally with patient Location:    Location:  Head/neck   Head/neck location:  Scalp Procedure details:    Wound appearance:  No signs of infection, good wound healing and clean   Number of sutures removed:  0   Number of staples removed:  7 Post-procedure details:    Post-removal:  No dressing applied   Procedure completion:  Tolerated well, no immediate complications     Medications Ordered in ED Medications - No data to display  ED Course/ Medical Decision Making/ A&P   43 year old here for evaluation of staple removal.  Wound is clean, dry, intact.  Staples removed without difficulty.  No wound dehiscence.  Discussed wound management will have him  follow-up outpatient  The patient has been appropriately medically screened and/or stabilized in the ED. I have low suspicion for any other emergent medical condition which would require further screening, evaluation or treatment in the ED or require inpatient management.  Patient is hemodynamically stable and in no acute distress.  Patient able to ambulate in department prior to ED.  Evaluation does not show acute pathology that would require ongoing or additional emergent interventions while in the emergency department or further inpatient treatment.  I have discussed the diagnosis with the patient and answered all questions.  Pain is been managed while in the emergency department and patient has no further complaints prior to discharge.  Patient is comfortable with plan discussed in room and is stable for discharge at this time.  I have discussed strict return precautions for returning to the emergency department.  Patient was encouraged to follow-up with PCP/specialist refer to at discharge.                                 Medical Decision Making Amount and/or Complexity of Data Reviewed External Data Reviewed: labs, radiology and notes.  Risk OTC drugs. Decision regarding hospitalization. Diagnosis or treatment significantly limited by social determinants of health.           Final Clinical Impression(s) / ED Diagnoses Final diagnoses:  Encounter for staple removal    Rx / DC Orders ED Discharge Orders     None         Zyonna Vardaman A, PA-C 05/30/23 2032    Glyn Ade, MD 05/30/23 2315

## 2023-08-07 ENCOUNTER — Emergency Department (HOSPITAL_COMMUNITY)
Admission: EM | Admit: 2023-08-07 | Discharge: 2023-08-07 | Disposition: A | Discharge status: Home / Self Care | Payer: Self-pay | Attending: Emergency Medicine | Admitting: Emergency Medicine

## 2023-08-07 ENCOUNTER — Encounter (HOSPITAL_COMMUNITY): Payer: Self-pay

## 2023-08-07 ENCOUNTER — Other Ambulatory Visit: Payer: Self-pay

## 2023-08-07 DIAGNOSIS — R55 Syncope and collapse: Secondary | ICD-10-CM | POA: Insufficient documentation

## 2023-08-07 DIAGNOSIS — R748 Abnormal levels of other serum enzymes: Secondary | ICD-10-CM | POA: Insufficient documentation

## 2023-08-07 DIAGNOSIS — D72819 Decreased white blood cell count, unspecified: Secondary | ICD-10-CM | POA: Insufficient documentation

## 2023-08-07 DIAGNOSIS — R112 Nausea with vomiting, unspecified: Secondary | ICD-10-CM | POA: Insufficient documentation

## 2023-08-07 DIAGNOSIS — E860 Dehydration: Secondary | ICD-10-CM

## 2023-08-07 LAB — COMPREHENSIVE METABOLIC PANEL WITH GFR
ALT: 48 U/L — ABNORMAL HIGH (ref 0–44)
AST: 38 U/L (ref 15–41)
Albumin: 3.9 g/dL (ref 3.5–5.0)
Alkaline Phosphatase: 55 U/L (ref 38–126)
Anion gap: 7 (ref 5–15)
BUN: 11 mg/dL (ref 6–20)
CO2: 20 mmol/L — ABNORMAL LOW (ref 22–32)
Calcium: 8.4 mg/dL — ABNORMAL LOW (ref 8.9–10.3)
Chloride: 108 mmol/L (ref 98–111)
Creatinine, Ser: 0.77 mg/dL (ref 0.61–1.24)
GFR, Estimated: >60 mL/min (ref >60)
Glucose, Bld: 95 mg/dL (ref 70–99)
Potassium: 4 mmol/L (ref 3.5–5.1)
Sodium: 135 mmol/L (ref 135–145)
Total Bilirubin: 0.4 mg/dL (ref 0.0–1.2)
Total Protein: 6.8 g/dL (ref 6.5–8.1)

## 2023-08-07 LAB — URINALYSIS, ROUTINE W REFLEX MICROSCOPIC
Bacteria, UA: NONE SEEN
Bilirubin Urine: NEGATIVE
Glucose, UA: NEGATIVE mg/dL
Ketones, ur: NEGATIVE mg/dL
Leukocytes,Ua: NEGATIVE
Nitrite: NEGATIVE
Protein, ur: NEGATIVE mg/dL
Specific Gravity, Urine: 1.009 (ref 1.005–1.030)
pH: 5 (ref 5.0–8.0)

## 2023-08-07 LAB — TROPONIN I (HIGH SENSITIVITY)
Troponin I (High Sensitivity): <2 ng/L (ref <18)
Troponin I (High Sensitivity): 3 ng/L (ref <18)

## 2023-08-07 LAB — CBG MONITORING, ED: Glucose-Capillary: 102 mg/dL — ABNORMAL HIGH (ref 70–99)

## 2023-08-07 LAB — CBC
HCT: 41.3 % (ref 39.0–52.0)
Hemoglobin: 13.9 g/dL (ref 13.0–17.0)
MCH: 31.4 pg (ref 26.0–34.0)
MCHC: 33.7 g/dL (ref 30.0–36.0)
MCV: 93.4 fL (ref 80.0–100.0)
Platelets: 225 10*3/uL (ref 150–400)
RBC: 4.42 MIL/uL (ref 4.22–5.81)
RDW: 13.2 % (ref 11.5–15.5)
WBC: 3.7 10*3/uL — ABNORMAL LOW (ref 4.0–10.5)
nRBC: 0 % (ref 0.0–0.2)

## 2023-08-07 LAB — CK: Total CK: 572 U/L — ABNORMAL HIGH (ref 49–397)

## 2023-08-07 MED ORDER — LACTATED RINGERS IV BOLUS
1000.0000 mL | Freq: Once | INTRAVENOUS | Status: AC
  Administered 2023-08-07: 1000 mL via INTRAVENOUS
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
## 2023-08-07 NOTE — Discharge Instructions (Signed)
 Please follow-up closely with your primary care doctor on an outpatient basis.  Return to emergency department immediately for any new or worsening symptoms.

## 2023-08-07 NOTE — ED Provider Notes (Signed)
 [place] EMERGENCY DEPARTMENT AT [facility] Provider Note   CSN: [id number] Arrival date & time: 08/07/23  1408     History  Chief Complaint  Patient presents with   Near Syncope    John Kane is a 43 y.o. male.  Patient is a 43 year old male who presents to the emergency department with a chief complaint of a near syncopal event earlier today.  He notes that this was associated with a bout of nausea and vomiting.  On arrival patient notes that he does feel back to his baseline.  He does note that this has happened previously secondary to dehydration and he does admit to poor p.o. fluid intake.  He does admit to alcohol intake today.  He denies any associated chest pain, shortness of breath, palpitations.  He denies any abdominal pain.  He did not fall or strike his head and denies any abnormal dizziness, lightheadedness.  He denies any numbness, paresthesias or unilateral weakness.   Near Syncope       Home Medications Prior to Admission medications   Medication Sig Start Date End Date Taking? Authorizing Provider  OLANZapine  zydis (ZYPREXA ) 5 MG disintegrating tablet Take 1 tablet (5 mg total) by mouth at bedtime. Patient not taking: Reported on 08/07/2023 04/30/23   Motley-Mangrum, Jadeka A, PMHNP      Allergies    Patient has no known allergies.    Review of Systems   Review of Systems  Cardiovascular:  Positive for near-syncope.  Gastrointestinal:  Positive for nausea and vomiting.  Neurological:        Near syncope    Physical Exam Updated Vital Signs BP (!) 131/97   Pulse 80   Temp 98.4 F (36.9 C) (Oral)   Resp 14   Ht 5\' 6"  (1.676 m)   Wt 76.7 kg   SpO2 96%   BMI 27.28 kg/m  Physical Exam Vitals and nursing note reviewed.  Constitutional:      Appearance: Normal appearance.  HENT:     Head: Normocephalic and atraumatic.     Nose: Nose normal.     Mouth/Throat:     Mouth: Mucous membranes are moist.  Eyes:     Extraocular  Movements: Extraocular movements intact.     Conjunctiva/sclera: Conjunctivae normal.     Pupils: Pupils are equal, round, and reactive to light.  Cardiovascular:     Rate and Rhythm: Normal rate and regular rhythm.     Pulses: Normal pulses.     Heart sounds: Normal heart sounds. No murmur heard.    No gallop.  Pulmonary:     Effort: Pulmonary effort is normal. No respiratory distress.     Breath sounds: Normal breath sounds. No stridor. No wheezing, rhonchi or rales.  Abdominal:     General: Abdomen is flat. Bowel sounds are normal. There is no distension.     Palpations: Abdomen is soft.     Tenderness: There is no abdominal tenderness. There is no guarding.  Musculoskeletal:        General: No tenderness or signs of injury. Normal range of motion.     Cervical back: Normal range of motion and neck supple. No rigidity or tenderness.     Right lower leg: No edema.     Left lower leg: No edema.  Skin:    General: Skin is warm and dry.     Findings: No bruising or rash.  Neurological:     General: No focal deficit present.  Mental Status: He is alert and oriented to person, place, and time. Mental status is at baseline.     Cranial Nerves: No cranial nerve deficit.     Sensory: No sensory deficit.     Motor: No weakness.     Coordination: Coordination normal.     Gait: Gait normal.  Psychiatric:        Mood and Affect: Mood normal.        Behavior: Behavior normal.        Thought Content: Thought content normal.        Judgment: Judgment normal.     ED Results / Procedures / Treatments   Labs (all labs ordered are listed, but only abnormal results are displayed) Labs Reviewed  COMPREHENSIVE METABOLIC PANEL WITH GFR - Abnormal; Notable for the following components:      Result Value   CO2 20 (*)    Calcium 8.4 (*)    ALT 48 (*)    All other components within normal limits  CBC - Abnormal; Notable for the following components:   WBC 3.7 (*)    All other components  within normal limits  URINALYSIS, ROUTINE W REFLEX MICROSCOPIC - Abnormal; Notable for the following components:   Color, Urine STRAW (*)    Hgb urine dipstick SMALL (*)    All other components within normal limits  CK - Abnormal; Notable for the following components:   Total CK 572 (*)    All other components within normal limits  CBG MONITORING, ED - Abnormal; Notable for the following components:   Glucose-Capillary 102 (*)    All other components within normal limits  TROPONIN I (HIGH SENSITIVITY)  TROPONIN I (HIGH SENSITIVITY)    EKG None  Radiology No results found.  Procedures Procedures    Medications Ordered in ED Medications  lactated ringers  bolus 1,000 mL (1,000 mLs Intravenous New Bag/Given 08/07/23 1647)    ED Course/ Medical Decision Making/ A&P Clinical Course as of 08/07/23 1817  Fri Aug 07, 2023  1626 CK Total(!): 572 [CR]    Clinical Course User Index [CR] Roselynn Connors, PA-C                                 Medical Decision Making Amount and/or Complexity of Data Reviewed Labs: ordered. Decision-making details documented in ED Course.   This patient presents to the ED for concern of near syncope, nausea, vomiting differential diagnosis includes dehydration, rhabdomyolysis, ACS, pulmonary embolus, CVA, TIA, electrolyte derangement, acute kidney injury    Additional history obtained:  Additional history obtained from none External records from outside source obtained and reviewed including none   Lab Tests:  I Ordered, and personally interpreted labs.  The pertinent results include: Mild leukopenia, no anemia, elevated CK, unremarkable urinalysis, normal electrolytes, normal kidney function liver function, negative serial troponins   Medicines ordered and prescription drug management:  I ordered medication including IV fluids for dehydration Reevaluation of the patient after these medicines showed that the patient improved I have  reviewed the patients home medicines and have made adjustments as needed   Problem List / ED Course:  Patient feels much better at this time and has returned back to his baseline.  Discussed with patient that all workup in the emergency department has been unremarkable.  Patient has no concerning neurological deficits noted on physical exam.  He is tolerating p.o. intake without difficulty.  He  has had no further nausea or vomiting.  Patient has had no chest pain, shortness of breath, palpitations.  Do not suspect ACS, pulmonary embolus, pericarditis, myocarditis, endocarditis.  He has no clinical indication for CVA or TIA.  There was no falls or blunt head trauma.  Do not suspect any advanced imaging is warranted at this time.  Close follow-up with her primary care doctor was discussed as well as strict turn precautions for any new or worsening symptoms.  Patient voiced understanding to the plan and had no additional questions.   Social Determinants of Health:  None           Final Clinical Impression(s) / ED Diagnoses Final diagnoses:  None    Rx / DC Orders ED Discharge Orders     None         Emmalene Hare 08/07/23 Bonnita Buttner, MD 08/09/23 1229

## 2023-08-07 NOTE — ED Provider Triage Note (Signed)
 Emergency Medicine Provider Triage Evaluation Note  John Kane , a 44 y.o. male  was evaluated in triage.  Pt complains of near syncopal event earlier today.  Notes that it was associated with vomiting.  Denies any active complaints or pain at this time.  Denies any abnormal headaches, chest pain, shortness of breath.  Does note that this has happened previously with poor fluid intake.  Review of Systems  Positive: Near syncope Negative: Chest pain, shortness of breath, palpitations, headache, numbness, paresthesias, unilateral weakness  Physical Exam  BP 129/86 (BP Location: Left Arm)   Pulse (!) 101   Temp 98.4 F (36.9 C) (Oral)   Resp 16   Ht 5\' 6"  (1.676 m)   Wt 76.7 kg   SpO2 97%   BMI 27.28 kg/m  Gen:   Awake, no distress   Resp:  Normal effort  MSK:   Moves extremities without difficulty  Other:    Medical Decision Making  Medically screening exam initiated at 2:29 PM.  Appropriate orders placed.  John Kane was informed that the remainder of the evaluation will be completed by another provider, this initial triage assessment does not replace that evaluation, and the importance of remaining in the ED until their evaluation is complete.  Labs ordered.  Awaiting bed in the back at this time.  No signs of acute distress.   John Connors, PA-C 08/07/23 1430

## 2024-02-05 ENCOUNTER — Emergency Department (HOSPITAL_COMMUNITY)
Admission: EM | Admit: 2024-02-05 | Discharge: 2024-02-05 | Discharge status: Against Medical Advice | Payer: Self-pay | Attending: Emergency Medicine | Admitting: Emergency Medicine

## 2024-02-05 DIAGNOSIS — Z5321 Procedure and treatment not carried out due to patient leaving prior to being seen by health care provider: Secondary | ICD-10-CM | POA: Insufficient documentation

## 2024-02-05 DIAGNOSIS — Z008 Encounter for other general examination: Secondary | ICD-10-CM | POA: Insufficient documentation

## 2024-02-05 NOTE — ED Triage Notes (Signed)
 Patient states that he wants to be checked into a psych ward. Denies being suicidal. He states he does not know what is going when asked why he wants to be checked into a psych ward. Possible ETOH.
